# Patient Record
Sex: Male | Born: 1975 | Hispanic: Yes | Marital: Married | Smoking: Never smoker
Health system: Southern US, Community
[De-identification: ages and names within clinical notes are randomized; demographics above are authoritative.]

## PROBLEM LIST (undated history)

## (undated) DIAGNOSIS — Z6827 Body mass index (BMI) 27.0-27.9, adult: Secondary | ICD-10-CM

## (undated) DIAGNOSIS — K219 Gastro-esophageal reflux disease without esophagitis: Secondary | ICD-10-CM

## (undated) HISTORY — DX: Gastro-esophageal reflux disease without esophagitis: K21.9

---

## 2011-12-02 ENCOUNTER — Other Ambulatory Visit: Payer: Self-pay

## 2011-12-02 ENCOUNTER — Encounter (HOSPITAL_COMMUNITY): Payer: Self-pay | Admitting: *Deleted

## 2011-12-02 ENCOUNTER — Emergency Department (INDEPENDENT_AMBULATORY_CARE_PROVIDER_SITE_OTHER): Payer: Self-pay

## 2011-12-02 ENCOUNTER — Emergency Department (HOSPITAL_COMMUNITY)
Admission: EM | Admit: 2011-12-02 | Discharge: 2011-12-03 | Disposition: A | Discharge status: Home / Self Care | Payer: Self-pay | Attending: Emergency Medicine | Admitting: Emergency Medicine

## 2011-12-02 ENCOUNTER — Emergency Department (HOSPITAL_COMMUNITY)
Admission: EM | Admit: 2011-12-02 | Discharge: 2011-12-02 | Disposition: A | Discharge status: Nursing Home (Includes ICF) | Payer: Self-pay | Source: Home / Self Care | Attending: Emergency Medicine | Admitting: Emergency Medicine

## 2011-12-02 DIAGNOSIS — R0789 Other chest pain: Secondary | ICD-10-CM | POA: Insufficient documentation

## 2011-12-02 DIAGNOSIS — R079 Chest pain, unspecified: Secondary | ICD-10-CM

## 2011-12-02 LAB — DIFFERENTIAL
Eosinophils Relative: 0 % (ref 0–5)
Lymphocytes Relative: 14 % (ref 12–46)
Lymphs Abs: 1.7 10*3/uL (ref 0.7–4.0)
Monocytes Absolute: 0.7 10*3/uL (ref 0.1–1.0)
Monocytes Relative: 6 % (ref 3–12)
Neutro Abs: 9.6 10*3/uL — ABNORMAL HIGH (ref 1.7–7.7)

## 2011-12-02 LAB — COMPREHENSIVE METABOLIC PANEL
BUN: 10 mg/dL (ref 6–23)
CO2: 28 mEq/L (ref 19–32)
Calcium: 10.2 mg/dL (ref 8.4–10.5)
Chloride: 100 mEq/L (ref 96–112)
Creatinine, Ser: 0.77 mg/dL (ref 0.50–1.35)
GFR calc Af Amer: >90 mL/min (ref >90)
GFR calc non Af Amer: >90 mL/min (ref >90)
Glucose, Bld: 124 mg/dL — ABNORMAL HIGH (ref 70–99)
Total Bilirubin: 0.3 mg/dL (ref 0.3–1.2)

## 2011-12-02 LAB — CBC
MCH: 30.2 pg (ref 26.0–34.0)
MCHC: 36.1 g/dL — ABNORMAL HIGH (ref 30.0–36.0)
RBC: 5.37 MIL/uL (ref 4.22–5.81)

## 2011-12-02 LAB — CK TOTAL AND CKMB (NOT AT ARMC)
CK, MB: 3.2 ng/mL (ref 0.3–4.0)
Relative Index: 2.5 (ref 0.0–2.5)

## 2011-12-02 LAB — POCT I-STAT TROPONIN I: Troponin i, poc: 0 ng/mL (ref 0.00–0.08)

## 2011-12-02 MED ORDER — IBUPROFEN 800 MG PO TABS
800.0000 mg | ORAL_TABLET | Freq: Once | ORAL | Status: AC
  Administered 2011-12-03: 800 mg via ORAL
  Filled 2011-12-02: qty 1

## 2011-12-02 MED ORDER — HYDROCODONE-ACETAMINOPHEN 5-325 MG PO TABS
1.0000 | ORAL_TABLET | Freq: Once | ORAL | Status: AC
  Administered 2011-12-03: 1 via ORAL
  Filled 2011-12-02: qty 1

## 2011-12-02 MED ORDER — DIAZEPAM 5 MG PO TABS
5.0000 mg | ORAL_TABLET | Freq: Once | ORAL | Status: AC
  Administered 2011-12-03: 5 mg via ORAL
  Filled 2011-12-02: qty 1

## 2011-12-02 NOTE — ED Provider Notes (Signed)
Chief Complaint  Patient presents with  . Chest Pain    History of Present Illness:  The patient is a 36 year old male who has had a two-day history of left submammary chest pain without radiation. He denies any injury to the area. The pain is very severe and tends to come and go. It's not deathly pleuritic. It's not associated with meals, exertion, or position. He denies any associated symptoms of fever, chills, cough, wheezing, shortness of breath, palpitations, dizziness, syncope, abdominal pain, nausea, vomiting, or diarrhea. He has had no prior cardiac history. He denies any cardiac risk factors.  Review of Systems:  Other than noted above, the patient denies any of the following symptoms. Systemic:  No fever, chills, sweats, or fatigue. ENT:  No nasal congestion, rhinorrhea, or sore throat. Pulmonary:  No cough, wheezing, shortness of breath, sputum production, hemoptysis. Cardiac:  No palpitations, rapid heartbeat, dizziness, presyncope or syncope. GI:  No abdominal pain, heartburn, nausea, or vomiting. Skin:  No rash or itching. Ext:  No leg pain or swelling.   PMFSH:  Past medical history, family history, social history, meds, and allergies were reviewed and updated as needed.  Physical Exam:   Vital signs:  BP 115/70  Pulse 80  Temp(Src) 99.3 F (37.4 C) (Oral)  Resp 16  SpO2 99% Gen:  Alert, oriented, in no distress, skin warm and dry. Eye:  PERRL, lids and conjunctivas normal.  Sclera non-icteric. ENT:  Mucous membranes moist, pharynx clear. Neck:  Supple, no adenopathy or tenderness.  No JVD. Lungs:  Clear to auscultation, no wheezes, rales or rhonchi.  No respiratory distress. Heart:  Regular rhythm.  No gallops, murmers, clicks or rubs. Chest:  No chest wall tenderness. Abdomen:  Soft, nontender, no organomegaly or mass.  Bowel sounds normal.  No pulsatile abdominal mass or bruit. Ext:  No edema.  No calf tenderness and Homann's sign negative.  Pulses full and  equal. Skin:  Warm and dry.  No rash.  Labs:  No results found for this or any previous visit.   Radiology:  Dg Chest 2 View  12/02/2011  *RADIOLOGY REPORT*  Clinical Data: Chest pain for 3 days.  CHEST - 2 VIEW  Comparison: None.  Findings: The cardiac silhouette is normal size and shape. Mediastinal and hilar contours appear normal. The lungs are well aerated and free of infiltrates. No pleural abnormality is evident. Bones appear average for age. No pneumothorax evident.  IMPRESSION: No acute or active cardiopulmonary or pleural abnormality is seen.  Original Report Authenticated By: Crawford Givens, M.D.    EKG:   Date: 12/02/2011  Rate: 78  Rhythm: normal sinus rhythm  QRS Axis: normal  Intervals: normal  ST/T Wave abnormalities: normal  Conduction Disutrbances:none  Narrative Interpretation:   Old EKG Reviewed: none available   Medications given in UCC:  None  Assessment:   Diagnoses that have been ruled out:  None  Diagnoses that are still under consideration:  None  Final diagnoses:  Chest pain    Plan:   1. to be sent to the emergency department by shuttle.  Roque Lias, MD 12/02/11 [phone]

## 2011-12-02 NOTE — ED Notes (Signed)
All results back.  Awaiting evaluation by provider for disposition.

## 2011-12-02 NOTE — ED Notes (Signed)
Pt  Has  Symptoms  Of  l  Sided  Chest  Pain  Worse  When  He  Takes  A  Deep  Breath or  When  He  Raises  His  l  Arm        He  denys  Any  specefic  Injury  Although  He  Works  Using a  [employer]

## 2011-12-02 NOTE — ED Notes (Signed)
Pt report given and care endorsed to Jonny Ruiz, [place]

## 2011-12-02 NOTE — ED Notes (Signed)
The pt  Is a [facility] transfer he has had lt upper chest pain for 4 days.  No previous history

## 2011-12-02 NOTE — ED Notes (Signed)
Pt reports that pain remains 10/10.  Pain is exacerbated by movement.  Pt denies n/v.  Wife at bedside.

## 2011-12-03 LAB — D-DIMER, QUANTITATIVE (NOT AT ARMC): D-Dimer, Quant: 0.35 ug/mL-FEU (ref 0.00–0.48)

## 2011-12-03 MED ORDER — HYDROCODONE-ACETAMINOPHEN 5-325 MG PO TABS: 1.0000 | ORAL_TABLET | ORAL | Status: AC | PRN

## 2011-12-03 MED ORDER — IBUPROFEN 400 MG PO TABS: 400.0000 mg | ORAL_TABLET | Freq: Three times a day (TID) | ORAL | Status: AC | PRN

## 2011-12-03 MED ORDER — DIAZEPAM 5 MG PO TABS: 5.0000 mg | ORAL_TABLET | Freq: Three times a day (TID) | ORAL | Status: AC | PRN

## 2011-12-03 NOTE — ED Notes (Signed)
Given PO meds. Tolerated well. Pt alert, NAD, calm, interactive, skin W&D, resps e/u, speaking in clear complete sentences. Family at [facility]. EDNP into see pt, pt denies questions, needs concerns or sx unmet.

## 2011-12-07 NOTE — ED Provider Notes (Signed)
History     CSN: [id number]  Arrival date & time 12/02/11  Jason Haynes   First MD Initiated Contact with Patient 12/02/11 2259      Chief Complaint  Patient presents with  . Chest Pain    HPI: Patient is a 36 y.o. male presenting with chest pain. The history is provided by the patient.  Chest Pain The chest pain began 2 days ago. Chest pain occurs constantly. The chest pain is worsening. At its most intense, the pain is at 9/10. The pain is currently at 7/10. The quality of the pain is described as sharp. The pain does not radiate. Chest pain is worsened by certain positions and deep breathing. Pertinent negatives for primary symptoms include no fever, no shortness of breath, no cough, no wheezing, no palpitations, no abdominal pain, no nausea, no vomiting and no dizziness.  Pertinent negatives for associated symptoms include no diaphoresis, no lower extremity edema, no numbness, no orthopnea and no weakness. He tried NSAIDs for the symptoms. Risk factors include male gender.  Pertinent negatives for past medical history include no anxiety/panic attacks, no CAD, no diabetes, no DVT, no hyperlipidemia, no hypertension, no MI, no PE and no recent injury.  Pertinent negatives for family medical history include: no CAD in family, no early MI in family, no stroke in family and no sudden death in family.  Procedure history is negative for cardiac catheterization and exercise treadmill test.   HPI verified w/ interpreter phones. Reports 2 day hx of (L) sided CP that has been, non-radiating and constant, though pt admits intensity varies. Denies SOB, nausea, diaphoresis, dizziness or weakness. States pain is worse w/ movement, deep inspiration and certain positions. 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 Denies hx of CAD, DVT, recent extended travel, significant family hx of CAD or hx smoking. Was seen at [facility] this evening  and sent to ED for further evaluation. History reviewed. No pertinent past medical history.  History reviewed. No pertinent past surgical history.  History reviewed. No pertinent family history.  History  Substance Use Topics  . Smoking status: Not on file  . Smokeless tobacco: Not on file  . Alcohol Use: Yes     rarely      Review of Systems  Constitutional: Negative.  Negative for fever and diaphoresis.  HENT: Negative.   Eyes: Negative.   Respiratory: Negative.  Negative for cough, shortness of breath and wheezing.   Cardiovascular: Positive for chest pain. Negative for palpitations and orthopnea.  Gastrointestinal: Negative.  Negative for nausea, vomiting and abdominal pain.  Genitourinary: Negative.   Musculoskeletal: Negative.   Skin: Negative.   Neurological: Negative.  Negative for dizziness, weakness and numbness.  Hematological: Negative.   Psychiatric/Behavioral: Negative.     Allergies  Review of patient's allergies indicates no known allergies.  Home Medications   Current Outpatient Rx  Name Route Sig Dispense Refill  . ACETAMINOPHEN 325 MG PO TABS Oral Take 650 mg by mouth every 6 (six) hours as needed. For pain    . DIAZEPAM 5 MG PO TABS Oral Take 1 tablet (5 mg total) by mouth every 8 (eight) hours as needed (Muscle pain/spasm). 10 tablet 0  . HYDROCODONE-ACETAMINOPHEN 5-325 MG PO TABS Oral Take 1 tablet by mouth every 4 (four) hours as needed for pain. 10 tablet 0  . IBUPROFEN 400 MG PO TABS Oral Take 1 tablet (400 mg total) by  mouth every 8 (eight) hours as needed for pain (1 PO TID x 3 days then PRN only). 15 tablet 0    BP 127/78  Pulse 93  Temp(Src) 98.8 F (37.1 C) (Oral)  Resp 18  SpO2 98%  Physical Exam  Constitutional: He is oriented to person, place, and time. He appears well-developed and well-nourished.  HENT:  Head: Normocephalic and atraumatic.  Eyes: Conjunctivae are normal.  Neck: Neck supple.  Cardiovascular: Normal rate and  regular rhythm.  Exam reveals no gallop and no friction rub.   No murmur heard. Pulmonary/Chest: Effort normal and breath sounds normal. He exhibits tenderness. He exhibits no crepitus.         (L) chest wall TTP  Abdominal: Soft. Bowel sounds are normal.  Musculoskeletal: Normal range of motion.  Neurological: He is alert and oriented to person, place, and time.  Skin: Skin is warm and dry.  Psychiatric: He has a normal mood and affect.    ED Course  Procedures   CXR, TROP I and remaining labs unremarkable. Assessment c/w musculoskeletal chest wall pain. Discussed pt w/ Dr Hyman Hopes. Will treat for musculoskeletal chest wall pain, obtain D-Dimer, 2nd Trop I  and re-eval.  D-Dimer and 2nd Trop I negative. Pt reports chest wall pain has improved w/ medication. Via interpreter phones findings and clinical impressions discussed w/ pt. Will plan for d/c home w/ tx for musculoskeletal chest wall pain and provide PCP referrals for f/u. Pt agreeable w/ plan.     Labs Reviewed  CBC - Abnormal; Notable for the following:    WBC 11.9 (*)    MCHC 36.1 (*)    All other components within normal limits  DIFFERENTIAL - Abnormal; Notable for the following:    Neutrophils Relative 80 (*)    Neutro Abs 9.6 (*)    All other components within normal limits  COMPREHENSIVE METABOLIC PANEL - Abnormal; Notable for the following:    Glucose, Bld 124 (*)    All other components within normal limits  CK TOTAL AND CKMB  POCT I-STAT TROPONIN I  POCT I-STAT TROPONIN I  D-DIMER, QUANTITATIVE  LAB REPORT - SCANNED   No results found.   1. Chest pain, musculoskeletal       MDM  HPI/PE and clinical findings c/w musculoskeletal chest wall pain. Pain worse w/ palpation, deep inspiration and certain positions. Few risk factors for CAD. CXR/EKG and w/o acute findings.No past med hx, VS stable,  Trop I neg x 2, D-Dimer negative. Relief voiced w/ muscle relaxer and medication for pain        Leanne Chang, NP 12/07/11 1004

## 2011-12-07 NOTE — ED Provider Notes (Signed)
Medical screening examination/treatment/procedure(s) were performed by non-physician practitioner and as supervising physician I was immediately available for consultation/collaboration.   Forbes Cellar, MD 12/07/11 [phone]

## 2014-04-12 ENCOUNTER — Encounter (HOSPITAL_COMMUNITY): Payer: Self-pay | Admitting: Emergency Medicine

## 2014-04-12 ENCOUNTER — Emergency Department (INDEPENDENT_AMBULATORY_CARE_PROVIDER_SITE_OTHER)
Admission: EM | Admit: 2014-04-12 | Discharge: 2014-04-12 | Disposition: A | Discharge status: Home / Self Care | Payer: Self-pay | Source: Home / Self Care

## 2014-04-12 DIAGNOSIS — J029 Acute pharyngitis, unspecified: Secondary | ICD-10-CM

## 2014-04-12 LAB — POCT RAPID STREP A: Streptococcus, Group A Screen (Direct): NEGATIVE

## 2014-04-12 NOTE — ED Notes (Signed)
Pt  Reports  Symptoms  Of  sorethroat   With  Swollen  Gland    And  Pain  When  He  Swallows   With  Symptoms  For  Several  Days

## 2014-04-12 NOTE — ED Provider Notes (Signed)
CSN: [id number]     Arrival date & time 04/12/14  0818 History   First MD Initiated Contact with Patient 04/12/14 0840     Chief Complaint  Patient presents with  . Sore Throat   (Consider location/radiation/quality/duration/timing/severity/associated sxs/prior Treatment) HPI Comments: 38 year old male reports a 2 day history of sore throat. Denies fever, chills, earache, abdominal pain, vomiting or PND.   History reviewed. No pertinent past medical history. History reviewed. No pertinent past surgical history. History reviewed. No pertinent family history. History  Substance Use Topics  . Smoking status: Not on file  . Smokeless tobacco: Not on file  . Alcohol Use: Yes     Comment: rarely    Review of Systems  Constitutional: Negative for fever, chills and activity change.  HENT: Positive for sore throat. Negative for congestion, postnasal drip and sinus pressure.   Eyes: Negative.   Respiratory: Negative for cough, shortness of breath and wheezing.   Cardiovascular: Negative.   Gastrointestinal: Negative.     Allergies  Review of patient's allergies indicates no known allergies.  Home Medications   Prior to Admission medications   Medication Sig Start Date End Date Taking? Authorizing Provider  acetaminophen (TYLENOL) 325 MG tablet Take 650 mg by mouth every 6 (six) hours as needed. For pain    Historical Provider, MD   BP 129/89  Pulse 80  Temp(Src) 98.8 F (37.1 C) (Oral)  Resp 18  SpO2 100% Physical Exam  Nursing note and vitals reviewed. Constitutional: He is oriented to person, place, and time. He appears well-developed and well-nourished. No distress.  HENT:  Head: Normocephalic and atraumatic.  Mouth/Throat: No oropharyngeal exudate.  Bilat TM's occluded by wax. OP with mild redness, clear PND  Eyes: Conjunctivae and EOM are normal.  Neck: Normal range of motion. Neck supple.  Cardiovascular: Normal rate, regular rhythm and normal heart sounds.    Pulmonary/Chest: Effort normal and breath sounds normal. No respiratory distress. He has no wheezes.  Lymphadenopathy:    He has no cervical adenopathy.  Neurological: He is alert and oriented to person, place, and time.  Skin: Skin is warm and dry.    ED Course  Procedures (including critical care time) Labs Review Labs Reviewed  POCT RAPID STREP A ([facility] ONLY)    Imaging Review No results found. Results for orders placed during the hospital encounter of 04/12/14  POCT RAPID STREP A ([facility] ONLY)      Result Value Ref Range   Streptococcus, Group A Screen (Direct) NEGATIVE  NEGATIVE     MDM   1. Pharyngitis     claritin or allegra Tylenol     Hayden Rasmussen, NP 04/12/14 [phone]

## 2014-04-12 NOTE — Discharge Instructions (Signed)
Faringitis (Pharyngitis) Use claritin or allegra to help with drainage Tylenol as needed La faringitis es el dolor de garganta (faringe). La garganta presenta enrojecimiento, hinchazn y dolor. CUIDADOS EN EL HOGAR   Beba suficiente lquido para mantener la orina clara o de color amarillo plido.  Solo tome los medicamentos que le haya indicado su mdico.  Si no toma los medicamentos segn las indicaciones podra volver a enfermarse. Finalice la prescripcin completa, aunque comience a sentirse mejor.  No tome aspirina.  Reposo.  Enjuguese la boca Arts administrator) con agua y sal (cucharadita de sal por litro de agua) cada 1 o 2horas. Esto ayudar a Engineer, materials.  Si no corre riesgo de ahogarse, puede chupar un caramelo duro o pastillas para la garganta. SOLICITE AYUDA SI:  Tiene bultos grandes y dolorosos al tacto en el cuello.  Tiene una erupcin cutnea.  Cuando tose elimina una expectoracin verde, amarillo amarronado o con [place]. SOLICITE AYUDA DE INMEDIATO SI:   Presenta rigidez en el cuello.  Babea o no puede tragar lquidos.  Vomita o no puede retener los [employer] lquidos.  Siente un dolor intenso que no se alivia con medicamentos.  Tiene problemas para Industrial/product designer (y no debido a la nariz tapada). ASEGRESE DE QUE:   Comprende estas instrucciones.  Controlar su afeccin.  Recibir ayuda de inmediato si no mejora o si empeora. Document Released: 01/16/2009 Document Revised: 08/10/2013 [facility] Patient Information 2014 [place], [place].  Grgaras de agua con sal ([place] Gargle) Esta solucin har que sienta alivio en la boca y la garganta. INSTRUCCIONES PARA EL CUIDADO DOMICILIARIO  Mezcle 1 cucharadita de sal en 8 onzas de agua tibia.  Haga grgaras con esta solucin con la frecuencia que necesite o segn le hayan indicado. Hgalo suavemente si tiene lesiones o lastimaduras en la boca.  No trague esta solucin. Document Released:  02/05/2009 Document Revised: 01/12/2012 [facility] Patient Information 2014 [place], [place].  Dolor de garganta  (Sore Throat)  El dolor de garganta es el dolor, ardor, irritacin o sensacin de picazn en la garganta. Generalmente hay dolor o molestias al tragar o hablar. Un dolor de garganta puede estar acompaado de otros sntomas, como tos, estornudos, fiebre y ganglios hinchados en el cuello. Generalmente es Financial risk analyst signo de otra enfermedad, como un resfrio, gripe, anginas o mononucleosis (conocida como mono). La mayor parte de los dolores de garganta desaparecen sin tratamiento mdico. CAUSAS  Las causas ms comunes de dolor de garganta son:   Infecciones virales, como un resfrio, gripe o mononucleosis.  Infeccin bacteriana, como faringitis estreptoccica, amigdalitis, o tos ferina.  Alergias estacionales.  La sequedad en el aire.  Algunos irritantes, como el humo o la polucin.  Reflujo gastroesofgico. INSTRUCCIONES PARA EL CUIDADO EN EL HOGAR   Tome slo la medicacin que le indic el mdico.  Debe ingerir gran cantidad de lquido para mantener la orina de tono claro o color amarillo plido.  Descanse todo lo que sea necesario.  Trate de usar [employer] para la garganta, pastillas o chupe caramelos duros para Engineer, materials (si es mayor de 4 aos o segn lo que le indiquen).  Beba lquidos calientes, como caldos, infusiones de hierbas o agua caliente con miel para calmar el dolor momentneamente. Tambin puede comer o beber lquidos fros o congelados tales como paletas de hielo congelado.  Haga grgaras con agua con sal (mezclar 1 cucharadita de sal en 8 onzas [250 cm3] de agua).  No fume, y evite el humo de otros  fumadores.  Ponga un humidificador de vapor fro en la habitacin por la noche para humedecer el aire. Tambin se puede activar en una ducha de agua caliente y sentarse en el bao con la puerta cerrada durante 5-10 minutos. SOLICITE ATENCIN MDICA DE  INMEDIATO SI:   Tiene dificultad para respirar.  No puede tragar lquidos, alimentos blandos, o su saliva.  Usted tiene ms inflamacin en la garganta.  El dolor de garganta no mejora en [address] das.  Tiene nuseas o vmitos.  Tiene fiebre o sntomas que persisten durante ms de 2 o 3 das.  Tiene fiebre y los sntomas empeoran de manera sbita. ASEGRESE DE QUE:   Comprende estas instrucciones.  Controlar su enfermedad.  Solicitar ayuda de inmediato si no mejora o si empeora. Document Released: 10/20/2005 Document Revised: 10/06/2012 [facility] Patient Information 2014 [place], [place].

## 2014-04-13 NOTE — ED Provider Notes (Signed)
Medical screening examination/treatment/procedure(s) were performed by a resident physician or non-physician practitioner and as the supervising physician I was immediately available for consultation/collaboration.  Kennia Vanvorst, MD    Shelbe Haglund S Lennan Malone, MD 04/13/14 0751 

## 2014-04-14 LAB — CULTURE, GROUP A STREP

## 2016-03-19 ENCOUNTER — Emergency Department (HOSPITAL_COMMUNITY)
Admission: EM | Admit: 2016-03-19 | Discharge: 2016-03-19 | Disposition: A | Discharge status: Home / Self Care | Payer: Self-pay | Attending: Emergency Medicine | Admitting: Emergency Medicine

## 2016-03-19 ENCOUNTER — Encounter (HOSPITAL_COMMUNITY): Payer: Self-pay | Admitting: *Deleted

## 2016-03-19 DIAGNOSIS — Z79899 Other long term (current) drug therapy: Secondary | ICD-10-CM | POA: Insufficient documentation

## 2016-03-19 DIAGNOSIS — H6123 Impacted cerumen, bilateral: Secondary | ICD-10-CM | POA: Insufficient documentation

## 2016-03-19 DIAGNOSIS — H9201 Otalgia, right ear: Secondary | ICD-10-CM | POA: Insufficient documentation

## 2016-03-19 MED ORDER — AMOXICILLIN 500 MG PO CAPS: 500.0000 mg | ORAL_CAPSULE | Freq: Two times a day (BID) | ORAL | Status: DC

## 2016-03-19 MED ORDER — NAPROXEN 500 MG PO TABS: 500.0000 mg | ORAL_TABLET | Freq: Two times a day (BID) | ORAL | Status: DC

## 2016-03-19 MED ORDER — CARBAMIDE PEROXIDE 6.5 % OT SOLN: 10.0000 [drp] | Freq: Two times a day (BID) | OTIC | Status: DC

## 2016-03-19 MED ORDER — DOCUSATE SODIUM 50 MG/5ML PO LIQD
50.0000 mg | Freq: Once | ORAL | Status: AC
  Administered 2016-03-19: 50 mg via OTIC
  Filled 2016-03-19: qty 10

## 2016-03-19 MED ORDER — NAPROXEN 250 MG PO TABS: 500.0000 mg | ORAL_TABLET | Freq: Once | ORAL | Status: DC

## 2016-03-19 NOTE — Discharge Instructions (Signed)
Tapn de cerumen (Cerumen Impaction) Las estructuras del canal auditivo externo secretan una sustancia cerosa llamada cerumen. El exceso de cerumen puede acumularse en el canal auditivo y causar una afeccin conocida como tapn de cerumen. El tapn de cerumen puede producir dolor de odo y Art therapist el funcionamiento de este rgano. La velocidad de produccin del cerumen es diferente en cada persona. En algunas, la estructura del canal auditivo puede reducir su capacidad de eliminar el cerumen de forma natural. CAUSAS La causa del tapn de cerumen es el exceso de produccin o la acumulacin de esta secrecin. FACTORES DE RIESGO  Usar hisopos frecuentemente para limpiarse los odos.  Tener los canales [employer] estrechos.  Tener eccema.  Estar deshidratado. SIGNOS Y SNTOMAS  Disminucin de la audicin.  Secrecin del odo.  Dolor de odo.  Picazn en el odo. TRATAMIENTO El tratamiento puede incluir lo siguiente:  Gotas ticas de venta libre o recetadas para ablandar el cerumen.  Extraccin del cerumen a cargo de Optician, dispensing. Esto se puede hacer de las siguientes maneras:  Lavado con agua tibia. Este es el mtodo de extraccin ms comn.  Cucharillas de raspado y otros instrumentos para el odo.  Ciruga. Esta puede realizarse [employer] casos son graves. [place] los medicamentos solamente como se lo haya indicado el mdico.  No se introduzca objetos en el odo con la intencin de limpiarlo. PREVENCIN  No se introduzca objetos en el odo, aunque sea con la intencin de limpiarlo. No es necesario quitar el cerumen como parte de la higiene normal, y no se recomienda el uso de hisopos en el canal auditivo.  Beba suficiente agua para mantener la orina clara o de color amarillo plido.  Controle el eccema, si lo tiene. SOLICITE ATENCIN MDICA SI:  Tiene dolor de odo.  Le sangra el odo.  El cerumen no se elimina despus de  colocarse gotas ticas como se lo indicaron.   Esta informacin no tiene Marine scientist el consejo del mdico. Asegrese de hacerle al mdico cualquier pregunta que tenga.   Document Released: 10/20/2005 Document Revised: 11/10/2014 Elsevier Interactive Patient Education 2016 [place] odos (Earache) El dolor de odos, tambin llamado otalgia, puede tener muchas causas. Puede ser agudo, sordo o ardiente, transitorio o Agricultural engineer. Los dolores de odos pueden deberse a problemas de los odos, como infecciones en el odo medio o el conducto auditivo externo, lesiones, tapones de cera, presin en el odo medio o un cuerpo extrao en el odo. Tambin, a problemas en otras zonas, lo que se conoce como dolor referido. Por ejemplo, el dolor puede deberse a una faringitis, una infeccin dental o problemas de la mandbula o de la articulacin que se encuentra entre la mandbula y el crneo (articulacin temporomandibular o ATM). No siempre es fcil identificar la causa de un dolor de odos. La observacin cautelosa puede ser la [place] en el caso de algunos dolores de odos, [place] se descubra una causa clara. INSTRUCCIONES PARA EL CUIDADO EN EL HOGAR Controle su afeccin para ver si hay cambios. Las siguientes medidas pueden servir para Public house manager cualquier molestia que est sintiendo:  Tome los medicamentos solamente como se lo haya indicado el mdico. Esto incluye la aplicacin de las gotas ticas.  Pngase hielo en la oreja para ayudar a Best boy.  Ponga el hielo en una bolsa plstica.  Coloque una toalla entre la piel y la bolsa de hielo.  Coloque el hielo  durante 85mnutos, 2 a 3veces por da.  No se ponga nada en el odo que no sean los medicamentos que eSpecial educational needs teacher  Intente descansar en posicin erguida, en lugar de recostarse. Esto puede ayudar a reducir la presin en el odo medio y aBest boy  Mastique goma de mascar si esto  ayuda a aBest boyde odos.  Controle cualquier alergia que tenga.  Concurra a todas las visitas de control como se lo haya indicado el mdico. Esto es importante. SOLICITE ATENCIN MDICA SI:  El dolor no mejora en el trmino de 2das.  Tiene fiebre.  Tiene sntomas nuevos o estos empeoran. SOLICITE ATENCIN MDICA DE INMEDIATO SI:  Sufre un dolor intenso de [place]  Presenta rigidez en el cuello.  Tiene dificultad para tragar.  Hay enrojecimiento o hinchazn detrs de la oreja.  Tiene secrecin del odo.  Tiene prdida de la audicin.  Siente mareos.   Esta informacin no tiene cMarine scientistel consejo del mdico. Asegrese de hacerle al mdico cualquier pregunta que tenga.   Document Released: 01/27/2008 Document Revised: 11/10/2014 Elsevier Interactive Patient Education 2[employer]

## 2016-03-19 NOTE — ED Provider Notes (Signed)
CSN: [id number]     Arrival date & time 03/19/16  0631 History   First MD Initiated Contact with Patient 03/19/16 [phone]     Chief Complaint  Patient presents with  . Otalgia   HPI  Jason Haynes is an 40 y.o. male with no significant PMH who presents to the ED for evaluation of right ear pain. History is obtained via Spanish language interpreter. Pt reports sharp, constant right ear pain since last night. He states it feels like the pain is inside. He does endorse some associated mild muffled hearing. Denies tinnitis. Denies external ear swelling or pain. Denies bleeding or discharge. He has not tried anything to alleviate his pain. He denies issues with his left ear. Denies URI symptoms, fever, chills.   History reviewed. No pertinent past medical history. History reviewed. No pertinent past surgical history. No family history on file. Social History  Substance Use Topics  . Smoking status: Never Smoker   . Smokeless tobacco: None  . Alcohol Use: Yes     Comment: rarely    Review of Systems  Constitutional: Negative for fever and chills.  HENT: Positive for ear pain and hearing loss. Negative for ear discharge, facial swelling, rhinorrhea, sneezing and sore throat.   Eyes: Negative.   Respiratory: Negative for cough.   Skin: Negative for rash and wound.  Neurological: Negative for dizziness and light-headedness.      Allergies  Review of patient's allergies indicates no known allergies.  Home Medications   Prior to Admission medications   Medication Sig Start Date End Date Taking? Authorizing Provider  acetaminophen (TYLENOL) 325 MG tablet Take 650 mg by mouth every 6 (six) hours as needed. For pain    Historical Provider, MD   BP 127/95 mmHg  Pulse 71  Temp(Src) 97.6 F (36.4 C)  Resp 16  SpO2 100% Physical Exam  Constitutional: He is oriented to person, place, and time. No distress.  HENT:  Head: Atraumatic.  Right Ear: External ear normal.  Left Ear: External  ear normal.  Nose: Nose normal.  Mouth/Throat: Oropharynx is clear and moist. No oropharyngeal exudate.  Bilateral cerumen impaction. TM unable to be visualized. No external ear erythema or edema. No external ear tenderness or tragal tenderness. No mastoid erythema, edema, or ttp. No trismus. No intraoral lesions.  Eyes: Conjunctivae and EOM are normal. Pupils are equal, round, and reactive to light. No scleral icterus.  Neck: Normal range of motion. Neck supple.  Cardiovascular: Normal rate and regular rhythm.   Pulmonary/Chest: Effort normal. No respiratory distress. He exhibits no tenderness.  Abdominal: Soft. He exhibits no distension. There is no tenderness.  Neurological: He is alert and oriented to person, place, and time.  Skin: Skin is warm and dry. He is not diaphoretic.  Psychiatric: He has a normal mood and affect. His behavior is normal.  Nursing note and vitals reviewed.   ED Course  .Ear Cerumen Removal Date/Time: 03/19/2016 6:59 AM Performed by: Carlene CoriaSAM, Timoteo Carreiro Y Authorized by: Carlene CoriaSAM, Koehn Salehi Y Consent: Verbal consent obtained. Risks and benefits: risks, benefits and alternatives were discussed Consent given by: patient Patient understanding: patient states understanding of the procedure being performed Patient identity confirmed: verbally with patient and arm band Local anesthetic: none Ceruminolytics applied: Ceruminolytics applied prior to the procedure. Location details: right ear Procedure type: curette and irrigation Patient sedated: no Patient tolerance: Patient tolerated the procedure well with no immediate complications    Labs Review Labs Reviewed - No data to display  Imaging Review No results found. I have personally reviewed and evaluated these images and lab results as part of my medical decision-making.   EKG Interpretation None      MDM   Final diagnoses:  Otalgia, right  Cerumen impaction, bilateral    Pt with bilateral cerumen impaction  making visualization of TMs impossible. Will perform cerumen removal and then re-examine. Otherwise ears NL with no evidence of otitis externa or mastoiditis.   I was able to remove only a minimal amount of pt's cerumen, both with curette and irrigation after ceruminolytics were applied. I still am unable to visualize either TM. Canal without erythema or edema. Will give rx for debrox and naproxen. Will also cover for AOM with amoxicillin. INstructed pt to call for ENT f/u. ER return precautions given.    Carlene Coria, PA-C 03/19/16 5409  Tilden Fossa, MD 03/23/16 [phone]

## 2016-03-19 NOTE — ED Notes (Signed)
Pt c/o R ear pain since last night; redness noted

## 2016-07-02 ENCOUNTER — Emergency Department (HOSPITAL_COMMUNITY): Payer: Self-pay

## 2016-07-02 ENCOUNTER — Encounter (HOSPITAL_COMMUNITY): Payer: Self-pay | Admitting: Emergency Medicine

## 2016-07-02 ENCOUNTER — Emergency Department (HOSPITAL_COMMUNITY)
Admission: EM | Admit: 2016-07-02 | Discharge: 2016-07-02 | Disposition: A | Discharge status: Home / Self Care | Payer: Self-pay | Attending: Emergency Medicine | Admitting: Emergency Medicine

## 2016-07-02 DIAGNOSIS — Z79899 Other long term (current) drug therapy: Secondary | ICD-10-CM | POA: Insufficient documentation

## 2016-07-02 DIAGNOSIS — R42 Dizziness and giddiness: Secondary | ICD-10-CM | POA: Insufficient documentation

## 2016-07-02 MED ORDER — MECLIZINE HCL 25 MG PO TABS
50.0000 mg | ORAL_TABLET | Freq: Once | ORAL | Status: AC
  Administered 2016-07-02: 50 mg via ORAL
  Filled 2016-07-02: qty 2

## 2016-07-02 MED ORDER — MECLIZINE HCL 25 MG PO TABS: 25.0000 mg | ORAL_TABLET | Freq: Three times a day (TID) | ORAL | 0 refills | Status: DC | PRN

## 2016-07-02 NOTE — ED Notes (Signed)
Patient transported to MRI 

## 2016-07-02 NOTE — ED Notes (Signed)
Pt dc home with significant other 

## 2016-07-02 NOTE — Discharge Instructions (Addendum)
Take medications until 24 hours without vertigo.

## 2016-07-02 NOTE — ED Notes (Signed)
MD at bedside while using interpreter to d/c and review d/c instructions. Significant other at bedside.

## 2016-07-02 NOTE — ED Triage Notes (Signed)
Pt from home with c/o blurred vision and dizziness x 3 months worsening today.  Denies hx of HTN or DM.  NAD, A&O.

## 2016-07-02 NOTE — ED Provider Notes (Signed)
[facility]-EMERGENCY DEPT Provider Note   CSN: [id number] Arrival date & time: 07/02/16  1355     History   Chief Complaint Chief Complaint  Patient presents with  . Blurred Vision  . Dizziness    HPI Jason Haynes is a 40 y.o. male with no major medical problems presents to the Emergency Department complaining of gradual, persistent, progressively worsening dizziness onset 3 mos ago after having his ears cleaned. Associated symptoms include flashes of lights in his vision onset at the same time.  He reports associated nausea and numbness in the pinky and ring fingers of both hands.  Pt described the dizziness as if the furniture in the room is spinning.   Pt reports a little pain in his neck in the right side But denies falls or trauma.  Patient reports that movement makes the symptoms worse. Nothing makes them better. He has been taking several vitamins prescribed in [place] without relief.     The history is provided by the patient and medical records. The history is limited by a language barrier. A language interpreter was used.    History reviewed. No pertinent past medical history.  There are no active problems to display for this patient.   History reviewed. No pertinent surgical history.     Home Medications    Prior to Admission medications   Medication Sig Start Date End Date Taking? Authorizing Provider  acetaminophen (TYLENOL) 325 MG tablet Take 650 mg by mouth every 6 (six) hours as needed. For pain    Historical Provider, MD  amoxicillin (AMOXIL) 500 MG capsule Take 1 capsule (500 mg total) by mouth 2 (two) times daily. 03/19/16   Ace Gins Sam, PA-C  carbamide peroxide (DEBROX) 6.5 % otic solution Place 10 drops into both ears 2 (two) times daily. 03/19/16   Ace Gins Sam, PA-C  naproxen (NAPROSYN) 500 MG tablet Take 1 tablet (500 mg total) by mouth 2 (two) times daily. 03/19/16   Carlene Coria, PA-C    Family History History reviewed. No pertinent family  history.  Social History Social History  Substance Use Topics  . Smoking status: Never Smoker  . Smokeless tobacco: Never Used  . Alcohol use No     Comment: rarely     Allergies   Review of patient's allergies indicates no known allergies.   Review of Systems Review of Systems  Eyes: Positive for visual disturbance.  Neurological: Positive for dizziness and numbness.  All other systems reviewed and are negative.    Physical Exam Updated Vital Signs BP 129/81 (BP Location: Left Arm)   Pulse 74   Temp 97.4 F (36.3 C) (Oral)   Resp 16   Ht 5' 8.9" (1.75 m)   Wt 75.3 kg   SpO2 100%   BMI 24.59 kg/m   Physical Exam  Constitutional: He is oriented to person, place, and time. He appears well-developed and well-nourished. No distress.  HENT:  Head: Normocephalic and atraumatic.  Mouth/Throat: Oropharynx is clear and moist.  Eyes: Conjunctivae and EOM are normal. Pupils are equal, round, and reactive to light. No scleral icterus.  No horizontal, vertical or rotational nystagmus  Neck: Normal range of motion. Neck supple.  Full active and passive ROM without pain No midline or paraspinal tenderness No nuchal rigidity or meningeal signs  Cardiovascular: Normal rate, regular rhythm and intact distal pulses.   Pulmonary/Chest: Effort normal and breath sounds normal. No respiratory distress. He has no wheezes. He has no rales.  Abdominal:  Soft. Bowel sounds are normal. There is no tenderness. There is no rebound and no guarding.  Musculoskeletal: Normal range of motion.  Lymphadenopathy:    He has no cervical adenopathy.  Neurological: He is alert and oriented to person, place, and time. He has normal reflexes. No cranial nerve deficit. He exhibits normal muscle tone. Coordination normal.  Mental Status:  Alert, oriented, thought content appropriate. Speech fluent without evidence of aphasia. Able to follow 2 step commands without difficulty.  Cranial Nerves:  II:   Peripheral visual fields grossly normal, pupils equal, round, reactive to light III,IV, VI: ptosis not present, extra-ocular motions intact bilaterally  V,VII: smile symmetric, facial light touch sensation equal VIII: hearing grossly normal bilaterally  IX,X: midline uvula rise  XI: bilateral shoulder shrug equal and strong XII: midline tongue extension  Motor:  5/5 in upper and lower extremities bilaterally including strong and equal grip strength and dorsiflexion/plantar flexion Sensory: Pinprick and light touch normal in bilateral lower extremities; subjectively decreased to the medial portion of the bilateral lower arms.  Deep Tendon Reflexes: 2+ and symmetric  Cerebellar: normal finger-to-nose with bilateral upper extremities Gait: normal gait and balance CV: distal pulses palpable throughout   Skin: Skin is warm and dry. No rash noted. He is not diaphoretic.  Psychiatric: He has a normal mood and affect. His behavior is normal. Judgment and thought content normal.  Nursing note and vitals reviewed.    ED Treatments / Results   Procedures Procedures (including critical care time)  Medications Ordered in ED Medications  meclizine (ANTIVERT) tablet 50 mg (50 mg Oral Given 07/02/16 1542)     Initial Impression / Assessment and Plan / ED Course  I have reviewed the triage vital signs and the nursing notes.  Pertinent labs & imaging results that were available during my care of the patient were reviewed by me and considered in my medical decision making (see chart for details).  Clinical Course  Value Comment By Time   The patient was discussed with and seen by Dr. Fayrene FearingJames who agrees with the treatment plan.   Dahlia ClientHannah Neeka Urista, PA-C 08/30 1500  BP: 129/81 Vital signs stable Dierdre ForthHannah Aneudy Champlain, PA-C 08/30 1546   At shift change care transferred to Dr. Rolland PorterMark James who will follow imaging. Dahlia ClientHannah Kree Rafter, PA-C 08/30 1548    Pt with dizziness, visual disturbance and  numbness in the bilateral upper extremities. Concern for possible MS versus other central causes of patient's vertigo. Will obtain MRI head and neck for further evaluation.      Final Clinical Impressions(s) / ED Diagnoses   Final diagnoses:  Vertigo  Dizziness    New Prescriptions New Prescriptions   No medications on file     Dierdre ForthHannah Karisa Nesser, PA-C 07/02/16 1549    Rolland PorterMark James, MD 07/02/16 [phone]

## 2020-10-28 ENCOUNTER — Other Ambulatory Visit: Payer: Self-pay

## 2020-10-28 ENCOUNTER — Emergency Department (HOSPITAL_COMMUNITY)
Admission: EM | Admit: 2020-10-28 | Discharge: 2020-10-29 | Disposition: A | Discharge status: Home / Self Care | Payer: HRSA Program | Attending: Emergency Medicine | Admitting: Emergency Medicine

## 2020-10-28 DIAGNOSIS — U071 COVID-19: Secondary | ICD-10-CM | POA: Insufficient documentation

## 2020-10-28 DIAGNOSIS — R1013 Epigastric pain: Secondary | ICD-10-CM | POA: Diagnosis present

## 2020-10-28 HISTORY — DX: Body mass index (BMI) 27.0-27.9, adult: Z68.27

## 2020-10-28 LAB — COMPREHENSIVE METABOLIC PANEL
ALT: 45 U/L — ABNORMAL HIGH (ref 0–44)
AST: 39 U/L (ref 15–41)
Albumin: 3.9 g/dL (ref 3.5–5.0)
Alkaline Phosphatase: 71 U/L (ref 38–126)
Anion gap: 13 (ref 5–15)
BUN: 13 mg/dL (ref 6–20)
CO2: 25 mmol/L (ref 22–32)
Calcium: 9.6 mg/dL (ref 8.9–10.3)
Chloride: 100 mmol/L (ref 98–111)
Creatinine, Ser: 1.04 mg/dL (ref 0.61–1.24)
GFR, Estimated: >60 mL/min (ref >60)
Glucose, Bld: 105 mg/dL — ABNORMAL HIGH (ref 70–99)
Potassium: 3.8 mmol/L (ref 3.5–5.1)
Sodium: 138 mmol/L (ref 135–145)
Total Bilirubin: 0.5 mg/dL (ref 0.3–1.2)
Total Protein: 7.3 g/dL (ref 6.5–8.1)

## 2020-10-28 LAB — CBC
HCT: 48.1 % (ref 39.0–52.0)
Hemoglobin: 16 g/dL (ref 13.0–17.0)
MCH: 28.9 pg (ref 26.0–34.0)
MCHC: 33.3 g/dL (ref 30.0–36.0)
MCV: 87 fL (ref 80.0–100.0)
Platelets: 106 10*3/uL — ABNORMAL LOW (ref 150–400)
RBC: 5.53 MIL/uL (ref 4.22–5.81)
RDW: 12.7 % (ref 11.5–15.5)
WBC: 3.7 10*3/uL — ABNORMAL LOW (ref 4.0–10.5)
nRBC: 0 % (ref 0.0–0.2)

## 2020-10-28 LAB — RESP PANEL BY RT-PCR (FLU A&B, COVID) ARPGX2
Influenza A by PCR: NEGATIVE
Influenza B by PCR: NEGATIVE
SARS Coronavirus 2 by RT PCR: POSITIVE — AB

## 2020-10-28 LAB — URINALYSIS, ROUTINE W REFLEX MICROSCOPIC
Bilirubin Urine: NEGATIVE
Glucose, UA: NEGATIVE mg/dL
Hgb urine dipstick: NEGATIVE
Ketones, ur: NEGATIVE mg/dL
Leukocytes,Ua: NEGATIVE
Nitrite: NEGATIVE
Protein, ur: NEGATIVE mg/dL
Specific Gravity, Urine: 1.019 (ref 1.005–1.030)
pH: 5 (ref 5.0–8.0)

## 2020-10-28 LAB — LIPASE, BLOOD: Lipase: 33 U/L (ref 11–51)

## 2020-10-28 MED ORDER — ACETAMINOPHEN 325 MG PO TABS: 650.0000 mg | ORAL_TABLET | Freq: Once | ORAL | Status: DC | PRN

## 2020-10-28 NOTE — ED Triage Notes (Addendum)
Pt c/o right flank pain and abdominal pain and states that it hurts when he voids. Also states he would like a Covid test because his stomach hurts. Pt also c/o anal pain. States has been taking Tylenol and something for the flu (Desenfriol).

## 2020-10-29 ENCOUNTER — Encounter (HOSPITAL_COMMUNITY): Payer: Self-pay | Admitting: Physician Assistant

## 2020-10-29 DIAGNOSIS — Z6827 Body mass index (BMI) 27.0-27.9, adult: Secondary | ICD-10-CM | POA: Insufficient documentation

## 2020-10-29 MED ORDER — ONDANSETRON HCL 4 MG PO TABS: 4.0000 mg | ORAL_TABLET | Freq: Four times a day (QID) | ORAL | 0 refills | Status: DC

## 2020-10-29 MED ORDER — ACETAMINOPHEN 325 MG PO TABS
650.0000 mg | ORAL_TABLET | Freq: Once | ORAL | Status: AC
  Administered 2020-10-29: 650 mg via ORAL
  Filled 2020-10-29: qty 2

## 2020-10-29 NOTE — ED Provider Notes (Signed)
[facility] EMERGENCY DEPARTMENT Provider Note   CSN: [id number] Arrival date & time: 10/28/20  1940     History Chief Complaint  Patient presents with  . Flank Pain    Ruhaan Nordahl Molli Hazard is a 44 y.o. male with no pertinent past medical history.  Patient presents with a chief complaint of epigastric pain and fever for the last 3 days.  Patient reports that his pain was in his epigastric region, was constant, 2/10 on the pain scale, no radiation, with no alleviating or aggravating symptoms.  Patient also reported nausea with his epigastric pain but denied any vomiting.  At present patient denies any abdominal pain or nausea.  Patient reports that he has had fevers for the past 3 days.  Patient ports that he has been taking Advil for his fevers.  Patient endorses minimal nonproductive cough, minimal generalized weakness.  No shortness of breath or difficulty breathing, no chest pain, no diarrhea, no difficulty urinating or dysuria, no headaches or syncope.  Patient denies receiving Covid or influenza vaccination.  This interview was conducted with the use of a Spanish interpreter.  HPI     Past Medical History:  Diagnosis Date  . BMI 27.0-27.9,adult     Patient Active Problem List   Diagnosis Date Noted  . BMI 27.0-27.9,adult     No past surgical history on file.     No family history on file.  Social History   Tobacco Use  . Smoking status: Never Smoker  . Smokeless tobacco: Never Used  Substance Use Topics  . Alcohol use: No    Comment: rarely  . Drug use: No    Home Medications Prior to Admission medications   Medication Sig Start Date End Date Taking? Authorizing Provider  ibuprofen (ADVIL) 200 MG tablet Take 200-400 mg by mouth as needed for fever, mild pain, moderate pain or headache.   Yes [provider]  carbamide peroxide (DEBROX) 6.5 % otic solution Place 10 drops into both ears 2 (two) times daily. Patient not taking: No sig  reported 03/19/16   Sam, Serena Y, PA-C  meclizine (ANTIVERT) 25 MG tablet Take 1 tablet (25 mg total) by mouth 3 (three) times daily as needed for dizziness. Patient not taking: No sig reported 07/02/16   Rolland Porter, MD  meclizine (ANTIVERT) 25 MG tablet Take 1 tablet (25 mg total) by mouth 3 (three) times daily as needed for dizziness. Patient not taking: No sig reported 07/02/16   Rolland Porter, MD  naproxen (NAPROSYN) 500 MG tablet Take 1 tablet (500 mg total) by mouth 2 (two) times daily. Patient not taking: No sig reported 03/19/16   Sam, Ace Gins, PA-C  ondansetron (ZOFRAN) 4 MG tablet Take 1 tablet (4 mg total) by mouth every 6 (six) hours. 10/29/20   Haskel Schroeder, PA-C    Allergies    Patient has no known allergies.  Review of Systems   Review of Systems  Constitutional: Positive for fever. Negative for chills.  Eyes: Negative for visual disturbance.  Respiratory: Positive for cough. Negative for shortness of breath.   Cardiovascular: Negative for chest pain and leg swelling.  Gastrointestinal: Positive for abdominal pain (epigastric) and nausea. Negative for abdominal distention, blood in stool, diarrhea and vomiting.  Genitourinary: Negative for difficulty urinating and dysuria.  Musculoskeletal: Negative for back pain and neck pain.  Skin: Negative for color change and rash.  Neurological: Negative for dizziness, syncope, light-headedness and headaches.  Psychiatric/Behavioral: Negative for confusion.  Physical Exam Updated Vital Signs BP 130/83   Pulse 97   Temp 99.4 F (37.4 C) (Oral)   Resp 12   Ht 5\' 5"  (1.651 m)   Wt 75 kg   SpO2 97%   BMI 27.51 kg/m   Physical Exam Vitals and nursing note reviewed.  Constitutional:      General: He is not in acute distress.    Appearance: He is not ill-appearing, toxic-appearing or diaphoretic.  HENT:     Head: Normocephalic.  Eyes:     General: No scleral icterus.       Right eye: No discharge.        Left  eye: No discharge.  Cardiovascular:     Rate and Rhythm: Normal rate and regular rhythm.     Heart sounds: Normal heart sounds.  Pulmonary:     Effort: Pulmonary effort is normal.     Breath sounds: Normal breath sounds.  Abdominal:     General: Abdomen is flat. Bowel sounds are normal. There is no distension.     Palpations: Abdomen is soft. There is no mass or pulsatile mass.     Tenderness: There is no abdominal tenderness. There is no guarding or rebound.  Musculoskeletal:     Cervical back: Neck supple.  Skin:    General: Skin is warm and dry.  Neurological:     General: No focal deficit present.     Mental Status: He is alert.  Psychiatric:        Behavior: Behavior is cooperative.     ED Results / Procedures / Treatments   Labs (all labs ordered are listed, but only abnormal results are displayed) Labs Reviewed  RESP PANEL BY RT-PCR (FLU A&B, COVID) ARPGX2 - Abnormal; Notable for the following components:      Result Value   SARS Coronavirus 2 by RT PCR POSITIVE (*)    All other components within normal limits  COMPREHENSIVE METABOLIC PANEL - Abnormal; Notable for the following components:   Glucose, Bld 105 (*)    ALT 45 (*)    All other components within normal limits  CBC - Abnormal; Notable for the following components:   WBC 3.7 (*)    Platelets 106 (*)    All other components within normal limits  LIPASE, BLOOD  URINALYSIS, ROUTINE W REFLEX MICROSCOPIC    EKG None  Radiology No results found.  Procedures Procedures (including critical care time)  Medications Ordered in ED Medications  acetaminophen (TYLENOL) tablet 650 mg (650 mg Oral Given 10/29/20 10/31/20)    ED Course  I have reviewed the triage vital signs and the nursing notes.  Pertinent labs & imaging results that were available during my care of the patient were reviewed by me and considered in my medical decision making (see chart for details).    MDM Rules/Calculators/A&P                           Alert 44 year old male no acute distress, nontoxic appearing.  Patient presents with complaint of epigastric pain and fever for the last 3 days.  At present patient denies any epigastric pain or nausea.  Labs were collected while patient was in waiting room.  Abdomen is soft, distended, nontender.  Lipase and hepatic function within normal limits.  Patient's epigastric pain and nausea likely related to his positive COVID-19 test.  Patient is unvaccinated.  No shortness of breath or difficulty breathing; patient is able  to speak in full complete sentences without difficulty.  SPO2 98% on room air, found to be 98% while ambulating.  Patient is in no acute distress, lungs clear to auscultation, and SPO2 maintained while ambulating, patient vitals stable, he is safe for discharge.    Based on BMI patient meets criteria for MAB infusion. Will send his information to MAB infusion team.  Patient was prescribed Zofran for nausea, and information for Tylenol for fever/pain control, told to isolate for the next 10 days and follow-up with primary care provider.  Discussed results, findings, treatment and follow up. Patient advised of return precautions. Patient verbalized understanding and agreed with plan.  Yonas Bunda was evaluated in Emergency Department on 10/29/2020 for the symptoms described in the history of present illness. He was evaluated in the context of the global COVID-19 pandemic, which necessitated consideration that the patient might be at risk for infection with the SARS-CoV-2 virus that causes COVID-19. Institutional protocols and algorithms that pertain to the evaluation of patients at risk for COVID-19 are in a state of rapid change based on information released by regulatory bodies including the CDC and federal and state organizations. These policies and algorithms were followed during the patient's care in the ED.   Final Clinical Impression(s) / ED Diagnoses Final  diagnoses:  COVID    Rx / DC Orders ED Discharge Orders         Ordered    ondansetron (ZOFRAN) 4 MG tablet  Every 6 hours        10/29/20 0952           Haskel Schroeder, PA-C 10/29/20 1012    Sabino Donovan, MD 10/29/20 [phone]

## 2020-10-29 NOTE — Discharge Instructions (Addendum)
You came to the emergency department today to be evaluated for your fever and stomach pain.  Your lab work and physical exam are reassuring that did not have any problems in your stomach that need further imaging.  You were found to be positive for COVID-19 today.  It is likely that this is causing your symptoms. Please read the following instructions below and attached paperwork for COVID-19.    Please read instructions below.  You can alternate Tylenol/acetaminophen and Advil/ibuprofen/Motrin every 4 hours for sore throat, body aches, headache or fever.  Drink plenty of water.  Use saline nasal spray for congestion. You can take zofran every 8 hours for nausea. Wash your hands frequently. Stay home and isolate yourself for minimum 14 days after your symptoms started, then at least 24 hours after fever-free in the absence of tylenol/ibuprofen and symptoms improving.  Follow up with your primary care provider. You should receive a call from the [facility] Antibody Infusion clinic for further instruction regarding appointment. Return to the ER for worsening shortness of breath, if you are unable to keep liquids down, or other concerning symptoms.

## 2020-10-29 NOTE — ED Notes (Signed)
Ambulated patient in room, patients O2 saturation remained at 98% while ambulating. Patient stated he felt okay while walking.

## 2020-10-29 NOTE — ED Notes (Signed)
Patient verbalized understanding of quarantine and management of pain/fever at home. Patient has no additional questions at this time and ambulated with steady and even gait toward the ED exit. No distress noted.

## 2020-10-30 ENCOUNTER — Telehealth: Payer: Self-pay | Admitting: Infectious Diseases

## 2020-10-30 NOTE — Telephone Encounter (Signed)
Called to Discuss with patient about Covid symptoms and the use of the monoclonal antibody infusion for those with mild to moderate Covid symptoms and at a high risk of hospitalization.     Pt appears to qualify for this infusion due to co-morbid conditions and/or a member of an at-risk group in accordance with the FDA Emergency Use Authorization.    [employer] Interpretor ID# [id number] for spanish interpretation   Symptom onset: 12/24 Vaccinated: no Qualified for Infusion: SVI risk score 4, BMI 27    He is "feeling good, but slight shoulder pain". This is intermittent and started a few days ago. He is otherwise having no problems with any COVID related symptoms. Politely declined treatment. Isolation precautions discussed.    Rexene Alberts, MSN, NP-C [facility]  [email or web address] Pager: [phone] Office: [phone] [facility] Main Line: [phone]

## 2020-11-04 ENCOUNTER — Ambulatory Visit (HOSPITAL_COMMUNITY)
Admission: EM | Admit: 2020-11-04 | Discharge: 2020-11-04 | Disposition: A | Discharge status: Home / Self Care | Payer: Self-pay

## 2020-11-04 ENCOUNTER — Encounter (HOSPITAL_COMMUNITY): Payer: Self-pay

## 2020-11-04 ENCOUNTER — Other Ambulatory Visit: Payer: Self-pay

## 2020-11-04 DIAGNOSIS — K219 Gastro-esophageal reflux disease without esophagitis: Secondary | ICD-10-CM

## 2020-11-04 MED ORDER — OMEPRAZOLE 20 MG PO CPDR: 20.0000 mg | DELAYED_RELEASE_CAPSULE | Freq: Every day | ORAL | 0 refills | Status: DC

## 2020-11-04 NOTE — ED Triage Notes (Signed)
Pt presents with epigastric, nausea and diarrhea ax 3 days. Reports feeling tingling sensation in the face and seeing blue lights since this morning. Denies chest pain, shortness of breath.   Pt reports he was seeing for abdominal pain 3 days ago and was prescribed ibuprofen.   Pt tested positive for COVID on 10/28/2020.

## 2020-11-04 NOTE — ED Provider Notes (Signed)
[facility]    CSN: [id number] Arrival date & time: 11/04/20  1020      History   Chief Complaint Chief Complaint  Patient presents with  . Abdominal Pain    + COVID 6 days ago  . Tingling  . Nausea  . Diarrhea    HPI Jason Haynes is a 45 y.o. male.   HPI   45 year old male here for evaluation of epigastric pain with nausea and diarrhea.  Patient reports that his epigastric pain started when he was diagnosed with Covid on 10/28/2020.  Patient reports that his diarrhea is off and on.  Patient states he is still running a fever that is responding to Tylenol.  Patient reports that he always has a sour taste in his mouth.  His epigastric pain increases when he is hungry and improves slightly after he eats.  Patient denies any burning in his throat, or sore throat in the morning.  Patient has not had any vomiting.  Patient was prescribed azithromycin which she is currently taking but he is unsure as to why.  There is no documentation in the chart of him being prescribed azithromycin.  Patient is also taking ibuprofen 800 mg.  Spanish interpreter Vicente Serene [phone] used for assessment and HPI.  Past Medical History:  Diagnosis Date  . BMI 27.0-27.9,adult     Patient Active Problem List   Diagnosis Date Noted  . BMI 27.0-27.9,adult     History reviewed. No pertinent surgical history.     Home Medications    Prior to Admission medications   Medication Sig Start Date End Date Taking? Authorizing Provider  benzonatate (TESSALON) 100 MG capsule Take by mouth 3 (three) times daily as needed for cough.   Yes [provider]  omeprazole (PRILOSEC) 20 MG capsule Take 1 capsule (20 mg total) by mouth daily. 11/04/20  Yes Becky Augusta, NP  carbamide peroxide (DEBROX) 6.5 % otic solution Place 10 drops into both ears 2 (two) times daily. Patient not taking: No sig reported 03/19/16   Sam, Serena Y, PA-C  ibuprofen (ADVIL) 200 MG tablet Take 200-400 mg by mouth as  needed for fever, mild pain, moderate pain or headache.    [provider]  meclizine (ANTIVERT) 25 MG tablet Take 1 tablet (25 mg total) by mouth 3 (three) times daily as needed for dizziness. Patient not taking: No sig reported 07/02/16   Rolland Porter, MD  meclizine (ANTIVERT) 25 MG tablet Take 1 tablet (25 mg total) by mouth 3 (three) times daily as needed for dizziness. Patient not taking: No sig reported 07/02/16   Rolland Porter, MD  naproxen (NAPROSYN) 500 MG tablet Take 1 tablet (500 mg total) by mouth 2 (two) times daily. Patient not taking: No sig reported 03/19/16   Sam, Ace Gins, PA-C  ondansetron (ZOFRAN) 4 MG tablet Take 1 tablet (4 mg total) by mouth every 6 (six) hours. 10/29/20   Haskel Schroeder, PA-C    Family History History reviewed. No pertinent family history.  Social History Social History   Tobacco Use  . Smoking status: Never Smoker  . Smokeless tobacco: Never Used  Substance Use Topics  . Alcohol use: No    Comment: rarely  . Drug use: No     Allergies   Patient has no known allergies.   Review of Systems Review of Systems  Constitutional: Positive for fever. Negative for activity change and appetite change.  Gastrointestinal: Positive for abdominal pain, diarrhea and nausea. Negative  for blood in stool and vomiting.  Skin: Negative for rash.  Hematological: Negative.   Psychiatric/Behavioral: Negative.      Physical Exam Triage Vital Signs ED Triage Vitals  Enc Vitals Group     BP 11/04/20 1136 137/78     Pulse Rate 11/04/20 1136 99     Resp 11/04/20 1136 20     Temp 11/04/20 1136 98.3 F (36.8 C)     Temp Source 11/04/20 1136 Oral     SpO2 11/04/20 1136 100 %     Weight --      Height --      Head Circumference --      Peak Flow --      Pain Score 11/04/20 1135 6     Pain Loc --      Pain Edu? --      Excl. in [place]? --    No data found.  Updated Vital Signs BP 137/78 (BP Location: Left Arm)   Pulse 99   Temp 98.3 F  (36.8 C) (Oral)   Resp 20   SpO2 100%   Visual Acuity Right Eye Distance:   Left Eye Distance:   Bilateral Distance:    Right Eye Near:   Left Eye Near:    Bilateral Near:     Physical Exam Vitals and nursing note reviewed.  Constitutional:      General: He is not in acute distress.    Appearance: He is well-developed. He is not toxic-appearing.  HENT:     Head: Normocephalic and atraumatic.  Cardiovascular:     Rate and Rhythm: Normal rate and regular rhythm.     Heart sounds: Normal heart sounds. No murmur heard. No gallop.   Pulmonary:     Effort: Pulmonary effort is normal.     Breath sounds: Normal breath sounds. No wheezing, rhonchi or rales.  Abdominal:     General: Abdomen is flat. Bowel sounds are normal. There is no distension.     Palpations: Abdomen is soft. There is no hepatomegaly or splenomegaly.     Tenderness: There is no abdominal tenderness.     Hernia: No hernia is present.  Skin:    General: Skin is warm and dry.     Capillary Refill: Capillary refill takes less than 2 seconds.     Findings: No erythema or rash.  Neurological:     General: No focal deficit present.     Mental Status: He is alert and oriented to person, place, and time.  Psychiatric:        Mood and Affect: Mood normal.        Behavior: Behavior normal.      UC Treatments / Results  Labs (all labs ordered are listed, but only abnormal results are displayed) Labs Reviewed - No data to display  EKG   Radiology No results found.  Procedures Procedures (including critical care time)  Medications Ordered in UC Medications - No data to display  Initial Impression / Assessment and Plan / UC Course  I have reviewed the triage vital signs and the nursing notes.  Pertinent labs & imaging results that were available during my care of the patient were reviewed by me and considered in my medical decision making (see chart for details).   Patient is here for evaluation of  what sounds like reflux disease.  Patient reports that he developed epigastric pain when he was first diagnosed with Covid.  Patient states that he has had a  sour taste in his mouth ever since but he denies sore throat or burning in his esophagus.  His symptoms increase when he is hungry and improve after he has eaten.  Patient's abdomen is soft, nontender, nondistended with positive bowel sounds in all 4 quadrants.  Will treat patient with Prilosec 20 mg daily and Pepcid 20 mg twice daily to help control acid production.  Patient advised to avoid acidic foods, spicy foods, or fried foods as these may make his symptoms worse.  Patient also advised to stop the ibuprofen but finished the azithromycin.  Both of these may be contributing to his symptoms.   Final Clinical Impressions(s) / UC Diagnoses   Final diagnoses:  Gastroesophageal reflux disease without esophagitis     Discharge Instructions     Stop the Ibuprofen and Benzonatate as these may be making your symptoms worse.  Avoid spicy foods, fried foods, or alcohol.  Take the Prilosec 20 mg daily in the morning and the Pepcid 20 mg twice daily.  Detenga el ibuprofeno y el benzonatato, ya que pueden empeorar sus sntomas.  Evite las comidas picantes, las frituras o el alcohol.  Tome Prilosec 20 mg al da por la maana y Pepcid 20 mg [employer].    ED Prescriptions    Medication Sig Dispense Auth. Provider   omeprazole (PRILOSEC) 20 MG capsule Take 1 capsule (20 mg total) by mouth daily. 30 capsule Becky Augusta, NP     PDMP not reviewed this encounter.   Becky Augusta, NP 11/04/20 1246

## 2020-11-04 NOTE — Discharge Instructions (Signed)
Stop the Ibuprofen and Benzonatate as these may be making your symptoms worse.  Avoid spicy foods, fried foods, or alcohol.  Take the Prilosec 20 mg daily in the morning and the Pepcid 20 mg twice daily.  Detenga el ibuprofeno y el benzonatato, ya que pueden empeorar sus sntomas.  Evite las comidas picantes, las frituras o el alcohol.  Tome Prilosec 20 mg al da por la maana y Pepcid 20 mg [employer].

## 2020-11-07 ENCOUNTER — Other Ambulatory Visit: Payer: Self-pay

## 2020-11-07 DIAGNOSIS — R1013 Epigastric pain: Secondary | ICD-10-CM | POA: Insufficient documentation

## 2020-11-08 ENCOUNTER — Other Ambulatory Visit: Payer: Self-pay

## 2020-11-08 ENCOUNTER — Encounter (HOSPITAL_COMMUNITY): Payer: Self-pay | Admitting: Emergency Medicine

## 2020-11-08 ENCOUNTER — Emergency Department (HOSPITAL_COMMUNITY)
Admission: EM | Admit: 2020-11-08 | Discharge: 2020-11-08 | Disposition: A | Discharge status: Home / Self Care | Payer: Self-pay | Attending: Emergency Medicine | Admitting: Emergency Medicine

## 2020-11-08 DIAGNOSIS — R1013 Epigastric pain: Secondary | ICD-10-CM

## 2020-11-08 LAB — URINALYSIS, ROUTINE W REFLEX MICROSCOPIC
Bilirubin Urine: NEGATIVE
Glucose, UA: NEGATIVE mg/dL
Hgb urine dipstick: NEGATIVE
Ketones, ur: NEGATIVE mg/dL
Leukocytes,Ua: NEGATIVE
Nitrite: NEGATIVE
Protein, ur: NEGATIVE mg/dL
Specific Gravity, Urine: 1.004 — ABNORMAL LOW (ref 1.005–1.030)
pH: 6 (ref 5.0–8.0)

## 2020-11-08 LAB — CBC
HCT: 41.7 % (ref 39.0–52.0)
Hemoglobin: 14.8 g/dL (ref 13.0–17.0)
MCH: 30 pg (ref 26.0–34.0)
MCHC: 35.5 g/dL (ref 30.0–36.0)
MCV: 84.6 fL (ref 80.0–100.0)
Platelets: 304 10*3/uL (ref 150–400)
RBC: 4.93 MIL/uL (ref 4.22–5.81)
RDW: 12.5 % (ref 11.5–15.5)
WBC: 6.2 10*3/uL (ref 4.0–10.5)
nRBC: 0 % (ref 0.0–0.2)

## 2020-11-08 LAB — COMPREHENSIVE METABOLIC PANEL
ALT: 91 U/L — ABNORMAL HIGH (ref 0–44)
AST: 40 U/L (ref 15–41)
Albumin: 3.6 g/dL (ref 3.5–5.0)
Alkaline Phosphatase: 73 U/L (ref 38–126)
Anion gap: 10 (ref 5–15)
BUN: 5 mg/dL — ABNORMAL LOW (ref 6–20)
CO2: 24 mmol/L (ref 22–32)
Calcium: 9.4 mg/dL (ref 8.9–10.3)
Chloride: 105 mmol/L (ref 98–111)
Creatinine, Ser: 0.79 mg/dL (ref 0.61–1.24)
GFR, Estimated: >60 mL/min (ref >60)
Glucose, Bld: 133 mg/dL — ABNORMAL HIGH (ref 70–99)
Potassium: 3.5 mmol/L (ref 3.5–5.1)
Sodium: 139 mmol/L (ref 135–145)
Total Bilirubin: 0.5 mg/dL (ref 0.3–1.2)
Total Protein: 6.9 g/dL (ref 6.5–8.1)

## 2020-11-08 LAB — LIPASE, BLOOD: Lipase: 35 U/L (ref 11–51)

## 2020-11-08 MED ORDER — ALUM & MAG HYDROXIDE-SIMETH 200-200-20 MG/5ML PO SUSP
30.0000 mL | Freq: Once | ORAL | Status: AC
  Administered 2020-11-08: 30 mL via ORAL
  Filled 2020-11-08: qty 30

## 2020-11-08 MED ORDER — OMEPRAZOLE 40 MG PO CPDR
40.0000 mg | DELAYED_RELEASE_CAPSULE | Freq: Every day | ORAL | 0 refills | Status: DC
Start: 2020-11-08 — End: 2020-11-28

## 2020-11-08 MED ORDER — FAMOTIDINE 20 MG PO TABS: 20.0000 mg | ORAL_TABLET | Freq: Three times a day (TID) | ORAL | 0 refills | Status: DC

## 2020-11-08 MED ORDER — LIDOCAINE VISCOUS HCL 2 % MT SOLN
15.0000 mL | Freq: Once | OROMUCOSAL | Status: AC
  Administered 2020-11-08: 15 mL via ORAL
  Filled 2020-11-08: qty 15

## 2020-11-08 MED ORDER — ACETAMINOPHEN 500 MG PO TABS
1000.0000 mg | ORAL_TABLET | Freq: Once | ORAL | Status: AC
  Administered 2020-11-08: 1000 mg via ORAL
  Filled 2020-11-08: qty 2

## 2020-11-08 NOTE — ED Provider Notes (Signed)
[facility] EMERGENCY DEPARTMENT Provider Note   CSN: [id number] Arrival date & time: 11/07/20  2338     History Chief Complaint  Patient presents with  . Abdominal Pain    Jason Haynes Jason Haynes is a 45 y.o. male presents to ER for evaluation of abdominal pain. This began over 3 weeks ago when he was diagnosed with COVID.  The pain is in the "mouth of the stomach" points to epigastrium. Constant, non radiating. It is worse when his stomach is empty and better if he eats something.  Pain is lingering throughout the day.  Does not completely go away. Reports for the last 3 weeks he has had intermittent looser stools but no frank watery diarrhea, melena, blood in his stool.  Stool is brown. No diarrhea this past week. Has had some nausea but no vomiting.  Went to urgent care recently and told to take omeprazole and famotidine which she has been taken but has not found significant improvement in symptoms.  No fever.  No vomiting.  No cough, chest pain.  No urinary symptoms.  Also reports that ever since he was diagnosed with Covid 3 weeks ago he has noticed that his hands are shaky intermittently.  He feels like the virus is still in his body.  Doesn't feel back to his usual self. Has not been back to work in the last 3 weeks and is asking when he can return to work.  Feels like if he is able to return to work his mind will be "clear" and he will feel better.  Denies significant alcohol use.  Used to drink alcohol frequently but quit 3 years ago.  Denies significant withdrawal symptoms in the past, seizures.  Denies significant anxiety currently.  Reports drinking energy drinks daily.  Was taking ibuprofen during the week that he was diagnosed with Covid but has since stopped because he was told that this would hurt his stomach.  HPI     Past Medical History:  Diagnosis Date  . BMI 27.0-27.9,adult     Patient Active Problem List   Diagnosis Date Noted  . BMI 27.0-27.9,adult      History reviewed. No pertinent surgical history.     No family history on file.  Social History   Tobacco Use  . Smoking status: Never Smoker  . Smokeless tobacco: Never Used  Substance Use Topics  . Alcohol use: No    Comment: rarely  . Drug use: No    Home Medications Prior to Admission medications   Medication Sig Start Date End Date Taking? Authorizing Provider  famotidine (PEPCID) 20 MG tablet Take 1 tablet (20 mg total) by mouth every 8 (eight) hours. Take before meals morning, afternoon and night 11/08/20  Yes Zanyah Lentsch J, PA-C  benzonatate (TESSALON) 100 MG capsule Take by mouth 3 (three) times daily as needed for cough.    [provider]  carbamide peroxide (DEBROX) 6.5 % otic solution Place 10 drops into both ears 2 (two) times daily. Patient not taking: No sig reported 03/19/16   Sam, Serena Y, PA-C  ibuprofen (ADVIL) 200 MG tablet Take 200-400 mg by mouth as needed for fever, mild pain, moderate pain or headache.    [provider]  meclizine (ANTIVERT) 25 MG tablet Take 1 tablet (25 mg total) by mouth 3 (three) times daily as needed for dizziness. Patient not taking: No sig reported 07/02/16   Tanna Furry, MD  meclizine (ANTIVERT) 25 MG tablet Take 1 tablet (  25 mg total) by mouth 3 (three) times daily as needed for dizziness. Patient not taking: No sig reported 07/02/16   Rolland Porter, MD  naproxen (NAPROSYN) 500 MG tablet Take 1 tablet (500 mg total) by mouth 2 (two) times daily. Patient not taking: No sig reported 03/19/16   Sam, Serena Y, PA-C  omeprazole (PRILOSEC) 40 MG capsule Take 1 capsule (40 mg total) by mouth daily. Take in the morning on an empty stomach 15-20 min before eating or drinking, daily 11/08/20 12/08/20  Liberty Handy, PA-C  ondansetron (ZOFRAN) 4 MG tablet Take 1 tablet (4 mg total) by mouth every 6 (six) hours. 10/29/20   Haskel Schroeder, PA-C    Allergies    Patient has no known allergies.  Review of Systems    Review of Systems  Gastrointestinal: Positive for abdominal pain, diarrhea (intermittent) and nausea.  Neurological: Positive for tremors.  All other systems reviewed and are negative.   Physical Exam Updated Vital Signs BP 125/83   Pulse (!) 102   Temp (!) 97.4 F (36.3 C) (Oral)   Resp 16   SpO2 99%   Physical Exam Vitals and nursing note reviewed.  Constitutional:      Appearance: He is well-developed.     Comments: Non toxic.  HENT:     Head: Normocephalic and atraumatic.     Nose: Nose normal.  Eyes:     Conjunctiva/sclera: Conjunctivae normal.  Cardiovascular:     Rate and Rhythm: Normal rate and regular rhythm.     Heart sounds: Normal heart sounds.  Pulmonary:     Effort: Pulmonary effort is normal.     Breath sounds: Normal breath sounds.  Abdominal:     General: Bowel sounds are normal.     Palpations: Abdomen is soft.     Tenderness: There is abdominal tenderness in the epigastric area.     Comments: No G/R/R. No suprapubic or CVA tenderness. Negative Murphy's and McBurney's  Musculoskeletal:        General: Normal range of motion.     Cervical back: Normal range of motion.  Skin:    General: Skin is warm and dry.     Capillary Refill: Capillary refill takes less than 2 seconds.  Neurological:     Mental Status: He is alert.     Comments: No tremors noted on hands/arms   Psychiatric:        Behavior: Behavior normal.     ED Results / Procedures / Treatments   Labs (all labs ordered are listed, but only abnormal results are displayed) Labs Reviewed  COMPREHENSIVE METABOLIC PANEL - Abnormal; Notable for the following components:      Result Value   Glucose, Bld 133 (*)    BUN 5 (*)    ALT 91 (*)    All other components within normal limits  URINALYSIS, ROUTINE W REFLEX MICROSCOPIC - Abnormal; Notable for the following components:   Color, Urine STRAW (*)    Specific Gravity, Urine 1.004 (*)    All other components within normal limits   LIPASE, BLOOD  CBC    EKG None  Radiology No results found.  Procedures Procedures (including critical care time)  Medications Ordered in ED Medications  alum & mag hydroxide-simeth (MAALOX/MYLANTA) 200-200-20 MG/5ML suspension 30 mL (30 mLs Oral Given 11/08/20 1018)    And  lidocaine (XYLOCAINE) 2 % viscous mouth solution 15 mL (15 mLs Oral Given 11/08/20 1019)  acetaminophen (TYLENOL) tablet 1,000 mg (1,000  mg Oral Given 11/08/20 1019)    ED Course  I have reviewed the triage vital signs and the nursing notes.  Pertinent labs & imaging results that were available during my care of the patient were reviewed by me and considered in my medical decision making (see chart for details).    MDM Rules/Calculators/A&P                           45 y.o. yo with chief complaint of 3 weeks of epigastric abdominal pain, nausea, intermittent diarrhea.  Symptoms began when he was diagnosed with Covid over 3 weeks ago.  Has noticed intermittent shakiness of his hands.  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  Was taking ibuprofen the first week of Covid.  Previous medical records available, triage and nursing notes reviewed to obtain more history and assist with MDM  Additional information obtained from urgent care note  Chief complain involves an extensive number of treatment options and is a complaint that carries with it a high risk of complications and morbidity and mortality.    Differential diagnosis: Highest on differential is GERD, gastritis, PUD.  Given chronicity of pain, diarrhea for the last 3 weeks acute cholecystitis, pancreatitis, diverticulitis considered but highly unlikely.  He could be having lingering Covid symptoms or other viral syndrome.  He has no radiation of pain into the chest, shortness of breath.  ER lab work and imaging ordered by triage RN and me, as above  I have personally visualized and interpreted ER diagnostic work up including labs  and imaging.    Labs reveal -vastly reassuring.  ALT 91.  Other LFTs, lipase, WBC and electrolytes normal.  Urinalysis without infection, RBCs.  Imaging reveals -I do not think emergent imaging is necessary today.  He has benign abdominal exam without peritonitis.  Labs are reassuring.  Vital signs normal without fever.  He had transient tachycardia that resolved without intervention.  Medications ordered -GI cocktail, Tylenol  Overall, clinical presentation and ER work up most suggestive of GI process like gastritis, GERD, PUD given history of recent ibuprofen use, remote alcohol use, location of pain and chronicity of symptoms.  Will increase patient's PPI and H2 blocker.  Discussed at length diet modifications, avoidance of NSAIDs.  He was given [facility] and recommended follow-up in the next week.  Return precautions discussed.  Patient advised that he can resume essential work duties since his Covid illness was more than 3 weeks ago.  In regards to his occasional, intermittent hand shakiness/tremors unclear etiology of this.  Electrolytes are normal.  He has no decreased numbness or pain associated with it.  Does report energy drink use, discussed importance of avoiding this.  Follow-up with PCP.  Final Clinical Impression(s) / ED Diagnoses Final diagnoses:  Epigastric abdominal pain    Rx / DC Orders ED Discharge Orders         Ordered    omeprazole (PRILOSEC) 40 MG capsule  Daily        11/08/20 0949    famotidine (PEPCID) 20 MG tablet  Every 8 hours        11/08/20 0949           Liberty Handy, PA-C 11/08/20 1135    Benjiman Core, MD 11/08/20 1558

## 2020-11-08 NOTE — ED Triage Notes (Signed)
Patient is 3 weeks post Covid, continues with nausea, abdominal pain and some mild diarrhea.  Patient states that he feels like the virus comes down his body and goes into his stomach and causes pain.

## 2020-11-08 NOTE — Discharge Instructions (Signed)
I think your symptoms are from gastritis, acid reflux or an ulcer  All of these conditions are treated the same way  I have changed your medicines   Take 40 mg omeprazole when you wake up on an empty stomach 15-20 min before meals  Take 20 mg famotidine before meals morning, afternoon, evening  Do not take any medicine with ibuprofen or aspirin in it. Do not drink alcohol. Do not drink or eat anything acidic or fatty (coffee, energy drinks, spicy greasy food)  Take [phone] mg acetaminophen for pain  You can return to work 10 days after the first day of COVID symptoms as long as you don't have a fever and your symptoms are improving  Return for fever greater than 100.21F, chest pain, shortness of breath, worsening constant abdominal pain, black or bloody stools  [facility] and make an appointment for re-evaluation in 1 week. They may refer you to gastroenterology if your symptoms have not improved

## 2020-11-08 NOTE — ED Notes (Signed)
ED Provider at bedside. 

## 2020-11-11 ENCOUNTER — Other Ambulatory Visit: Payer: Self-pay

## 2020-11-11 ENCOUNTER — Emergency Department (HOSPITAL_COMMUNITY)
Admission: EM | Admit: 2020-11-11 | Discharge: 2020-11-11 | Disposition: A | Discharge status: Home / Self Care | Payer: Self-pay | Attending: Emergency Medicine | Admitting: Emergency Medicine

## 2020-11-11 ENCOUNTER — Encounter (HOSPITAL_COMMUNITY): Payer: Self-pay | Admitting: Emergency Medicine

## 2020-11-11 DIAGNOSIS — R1013 Epigastric pain: Secondary | ICD-10-CM | POA: Insufficient documentation

## 2020-11-11 LAB — COMPREHENSIVE METABOLIC PANEL
ALT: 59 U/L — ABNORMAL HIGH (ref 0–44)
AST: 31 U/L (ref 15–41)
Albumin: 3.8 g/dL (ref 3.5–5.0)
Alkaline Phosphatase: 88 U/L (ref 38–126)
Anion gap: 10 (ref 5–15)
BUN: 14 mg/dL (ref 6–20)
CO2: 25 mmol/L (ref 22–32)
Calcium: 9.8 mg/dL (ref 8.9–10.3)
Chloride: 101 mmol/L (ref 98–111)
Creatinine, Ser: 0.85 mg/dL (ref 0.61–1.24)
GFR, Estimated: >60 mL/min (ref >60)
Glucose, Bld: 143 mg/dL — ABNORMAL HIGH (ref 70–99)
Potassium: 4.3 mmol/L (ref 3.5–5.1)
Sodium: 136 mmol/L (ref 135–145)
Total Bilirubin: 0.5 mg/dL (ref 0.3–1.2)
Total Protein: 7.4 g/dL (ref 6.5–8.1)

## 2020-11-11 LAB — CBC
HCT: 45.3 % (ref 39.0–52.0)
Hemoglobin: 15.2 g/dL (ref 13.0–17.0)
MCH: 29 pg (ref 26.0–34.0)
MCHC: 33.6 g/dL (ref 30.0–36.0)
MCV: 86.5 fL (ref 80.0–100.0)
Platelets: 354 10*3/uL (ref 150–400)
RBC: 5.24 MIL/uL (ref 4.22–5.81)
RDW: 12.6 % (ref 11.5–15.5)
WBC: 8.2 10*3/uL (ref 4.0–10.5)
nRBC: 0 % (ref 0.0–0.2)

## 2020-11-11 LAB — URINALYSIS, ROUTINE W REFLEX MICROSCOPIC
Bilirubin Urine: NEGATIVE
Glucose, UA: NEGATIVE mg/dL
Hgb urine dipstick: NEGATIVE
Ketones, ur: NEGATIVE mg/dL
Leukocytes,Ua: NEGATIVE
Nitrite: NEGATIVE
Protein, ur: NEGATIVE mg/dL
Specific Gravity, Urine: 1.012 (ref 1.005–1.030)
pH: 5 (ref 5.0–8.0)

## 2020-11-11 LAB — LIPASE, BLOOD: Lipase: 38 U/L (ref 11–51)

## 2020-11-11 MED ORDER — LIDOCAINE VISCOUS HCL 2 % MT SOLN
15.0000 mL | Freq: Once | OROMUCOSAL | Status: AC
  Administered 2020-11-11: 15 mL via ORAL
  Filled 2020-11-11: qty 15

## 2020-11-11 MED ORDER — LACTATED RINGERS IV BOLUS
1000.0000 mL | Freq: Once | INTRAVENOUS | Status: AC
  Administered 2020-11-11: 1000 mL via INTRAVENOUS

## 2020-11-11 MED ORDER — SUCRALFATE 1 G PO TABS: 1.0000 g | ORAL_TABLET | Freq: Three times a day (TID) | ORAL | 0 refills | Status: DC

## 2020-11-11 MED ORDER — ALUM & MAG HYDROXIDE-SIMETH 200-200-20 MG/5ML PO SUSP
30.0000 mL | Freq: Once | ORAL | Status: AC
  Administered 2020-11-11: 30 mL via ORAL
  Filled 2020-11-11: qty 30

## 2020-11-11 NOTE — ED Notes (Signed)
Dc instructions reviewed with pt. PT verbalized understanding. PT DC 

## 2020-11-11 NOTE — ED Provider Notes (Signed)
[facility] EMERGENCY DEPARTMENT Provider Note   CSN: [id number] Arrival date & time: 11/11/20  4174     History Chief Complaint  Patient presents with  . Abdominal Pain    Jason Haynes is a 45 y.o. male with a past medical history of COVID-19 with symptoms starting approximately 10/25/2020, who presents today for evaluation of epigastric abdominal pain.  He had diarrhea was sick with Covid and reports that has improved.  His pain waxes and wanes.  He states that his pain is made worse with eating foods in general however he notes sometimes if the food is very bland that will improve his pain.  He denies any fevers, cough, chest pain or shortness of breath.  He reports that he has not had any leg swelling.  No hematemesis.  No continued cough.  He states that he is currently taking Pepcid and Prilosec however he continues to have pain.    He does report that when he had COVID he was taking ibuprofen frequently to help with the pain.  He denies current alcohol use.  He was seen for the same thing on 11/04/20 and 11/08/20.  He denies relief of his symptoms.    He reports that he has not been drinking much, today all day has only had 12 ounces of water.  He states that when he drinks water he stops because he feels fully very quickly and reports feeling dehydrated.     HPI     Past Medical History:  Diagnosis Date  . BMI 27.0-27.9,adult     Patient Active Problem List   Diagnosis Date Noted  . BMI 27.0-27.9,adult     History reviewed. No pertinent surgical history.     No family history on file.  Social History   Tobacco Use  . Smoking status: Never Smoker  . Smokeless tobacco: Never Used  Substance Use Topics  . Alcohol use: No    Comment: rarely  . Drug use: No    Home Medications Prior to Admission medications   Medication Sig Start Date End Date Taking? Authorizing Provider  sucralfate (CARAFATE) 1 g tablet Take 1 tablet (1 g total) by mouth  4 (four) times daily -  with meals and at bedtime for 21 days. 11/11/20 12/02/20 Yes Cristina Gong, PA-C  benzonatate (TESSALON) 100 MG capsule Take by mouth 3 (three) times daily as needed for cough.    [provider]  famotidine (PEPCID) 20 MG tablet Take 1 tablet (20 mg total) by mouth every 8 (eight) hours. Take before meals morning, afternoon and night 11/08/20   Liberty Handy, PA-C  omeprazole (PRILOSEC) 40 MG capsule Take 1 capsule (40 mg total) by mouth daily. Take in the morning on an empty stomach 15-20 min before eating or drinking, daily 11/08/20 12/08/20  Liberty Handy, PA-C  ondansetron (ZOFRAN) 4 MG tablet Take 1 tablet (4 mg total) by mouth every 6 (six) hours. 10/29/20   Haskel Schroeder, PA-C    Allergies    Patient has no known allergies.  Review of Systems   Review of Systems  Constitutional: Negative for chills and fever.  HENT: Negative for congestion.   Respiratory: Negative for cough, shortness of breath and wheezing.   Cardiovascular: Negative for chest pain.  Gastrointestinal: Positive for abdominal pain (Epigastric). Negative for anal bleeding, diarrhea, nausea and vomiting.  Genitourinary: Negative for dysuria and urgency.  Musculoskeletal: Negative for back pain and neck pain.  Skin: Negative  for color change and rash.  Neurological: Negative for weakness and headaches.  Psychiatric/Behavioral: Negative for confusion and self-injury.  All other systems reviewed and are negative.   Physical Exam Updated Vital Signs BP 116/69 (BP Location: Left Arm)   Pulse 96   Temp 98.7 F (37.1 C) (Oral)   Resp 18   SpO2 96%   Physical Exam Vitals and nursing note reviewed.  Constitutional:      General: He is not in acute distress.    Appearance: He is well-developed and well-nourished. He is not diaphoretic.  HENT:     Head: Normocephalic and atraumatic.  Eyes:     General: No scleral icterus.       Right eye: No discharge.        Left  eye: No discharge.     Conjunctiva/sclera: Conjunctivae normal.  Cardiovascular:     Rate and Rhythm: Normal rate and regular rhythm.     Heart sounds: No murmur heard.   Pulmonary:     Effort: Pulmonary effort is normal. No respiratory distress.     Breath sounds: Normal breath sounds. No stridor.  Abdominal:     General: There is no distension.     Palpations: Abdomen is soft.     Tenderness: There is abdominal tenderness in the epigastric area. There is no guarding or rebound.     Hernia: No hernia is present.  Musculoskeletal:        General: No deformity or edema.     Cervical back: Normal range of motion and neck supple.  Skin:    General: Skin is warm and dry.  Neurological:     General: No focal deficit present.     Mental Status: He is alert.     Cranial Nerves: No cranial nerve deficit.     Motor: No abnormal muscle tone.  Psychiatric:        Mood and Affect: Mood and affect and mood normal.        Behavior: Behavior normal.     ED Results / Procedures / Treatments   Labs (all labs ordered are listed, but only abnormal results are displayed) Labs Reviewed  COMPREHENSIVE METABOLIC PANEL - Abnormal; Notable for the following components:      Result Value   Glucose, Bld 143 (*)    ALT 59 (*)    All other components within normal limits  LIPASE, BLOOD  CBC  URINALYSIS, ROUTINE W REFLEX MICROSCOPIC    EKG None  Radiology No results found.  Procedures Procedures (including critical care time) ED Peripheral IV (Provider)  Date/Time: 11/11/2020 7:45 PM Performed by: Cristina Gong, PA-C Authorized by: Cristina Gong, PA-C   Procedure details:    Indications: hydration,     Skin Prep: chlorhexidine gluconate     Location:  Right hand   Angiocath:  20 G   Bedside Ultrasound Guided: No     Patient tolerated procedure without complications: Yes     Dressing applied: Yes   Got flash, Flushed easily.    Medications Ordered in ED Medications   lactated ringers bolus 1,000 mL (0 mLs Intravenous Stopped 11/11/20 2108)  alum & mag hydroxide-simeth (MAALOX/MYLANTA) 200-200-20 MG/5ML suspension 30 mL (30 mLs Oral Given 11/11/20 2104)    And  lidocaine (XYLOCAINE) 2 % viscous mouth solution 15 mL (15 mLs Oral Given 11/11/20 2104)    ED Course  I have reviewed the triage vital signs and the nursing notes.  Pertinent labs & imaging  results that were available during my care of the patient were reviewed by me and considered in my medical decision making (see chart for details).  Clinical Course as of 11/11/20 2248  Wynelle Link Nov 11, 2020  1936 Patients primary RN is occupied with a sick patient.  I started his IV and gave him water and crackers.  [EH]    Clinical Course User Index [EH] Norman Clay   MDM Rules/Calculators/A&P                         Jason Haynes was evaluated in Emergency Department on 11/11/2020 for the symptoms described in the history of present illness. He was evaluated in the context of the global COVID-19 pandemic, which necessitated consideration that the patient might be at risk for infection with the SARS-CoV-2 virus that causes COVID-19. Institutional protocols and algorithms that pertain to the evaluation of patients at risk for COVID-19 are in a state of rapid change based on information released by regulatory bodies including the CDC and federal and state organizations. These policies and algorithms were followed during the patient's care in the ED.  Patient is a 45 year old Spanish-speaking man who presents today for evaluation of epigastric abdominal pain.  This is been on going since he had covid.  He was taking ibuprofen when he had covid.    He has been taking pepcid and prilosec with out relief.  He was tachycardic on arrival.  His abdomen is only tender in the epigastrium.  He is afebrile.  He does report generally poor water intake and thinks he may be dehydrated.  Patient does not have a  significant leukocytosis or anemia, CBC, lipase, CMP are all unremarkable.  UA without evidence of infection.  Patient denies specific chest pain or shortness of breath.  EKG without obvious ischemia. While referred intrathoracic pain is considered, given the evening changes his symptoms I suspect that this is gastritis from being sick with Covid, combined with NSAID use.  He is given 1 L of IV fluids to help rehydrate even though his tachycardia had proved without specific treatment.  If he had a surgical cause of his symptoms at this point I would expect him to have leukocytosis or more severe symptoms.   He is treated with GI cocktail and given a perscription to add carafate into his medications.  Additionally we discussed, through professional Spanish-speaking medical interpreter, the importance of GI follow-up.  Patient was able to tolerate crackers and water while in the emergency room without significant difficulty.  Return precautions were discussed with patient who states their understanding.  At the time of discharge patient denied any unaddressed complaints or concerns.  Patient is agreeable for discharge home.  Note: Portions of this report may have been transcribed using voice recognition software. Every effort was made to ensure accuracy; however, inadvertent computerized transcription errors may be present  Final Clinical Impression(s) / ED Diagnoses Final diagnoses:  Epigastric pain    Rx / DC Orders ED Discharge Orders         Ordered    sucralfate (CARAFATE) 1 g tablet  3 times daily with meals & bedtime        11/11/20 2101           Cristina Gong, PA-C 11/11/20 2248    Melene Plan, DO 11/11/20 2308

## 2020-11-11 NOTE — ED Triage Notes (Signed)
C/o abd pain with diarrhea x 5 days.  Denies nausea and vomiting.  States he has been seen in ED for same post-COVID.

## 2020-11-12 ENCOUNTER — Encounter: Payer: Self-pay | Admitting: Physician Assistant

## 2020-11-28 ENCOUNTER — Ambulatory Visit (INDEPENDENT_AMBULATORY_CARE_PROVIDER_SITE_OTHER): Payer: Self-pay | Admitting: Physician Assistant

## 2020-11-28 ENCOUNTER — Encounter: Payer: Self-pay | Admitting: Physician Assistant

## 2020-11-28 VITALS — BP 119/80 | HR 90 | Ht 64.0 in | Wt 169.0 lb

## 2020-11-28 DIAGNOSIS — R1013 Epigastric pain: Secondary | ICD-10-CM

## 2020-11-28 DIAGNOSIS — K219 Gastro-esophageal reflux disease without esophagitis: Secondary | ICD-10-CM

## 2020-11-28 MED ORDER — OMEPRAZOLE 40 MG PO CPDR: 40.0000 mg | DELAYED_RELEASE_CAPSULE | Freq: Two times a day (BID) | ORAL | 3 refills | Status: AC

## 2020-11-28 NOTE — Progress Notes (Signed)
Reviewed and agree with management plan.  Arlett Goold T. Shemeka Wardle, MD FACG [phone]  

## 2020-11-28 NOTE — Patient Instructions (Signed)
Aumente su omeprazol a dos veces [employer]. Tomar 30 minutos antes del desayuno y 30 minutos antes de la cena.  Se ha enviado una receta de omeprazol dos veces al da a su farmacia de [place].  Llame a la oficina si sus sntomas no mejoran.   Si tiene 64 aos o menos, su ndice de [employer] corporal debe estar entre 19 y 53. Su ndice de masa corporal es de 29,01 kg/m. Si esto est fuera del rango mencionado anteriormente, considere hacer un seguimiento con su proveedor de Marine scientist.    Thank you for choosing me and [facility].  Hyacinth Meeker PA

## 2020-11-28 NOTE — Progress Notes (Signed)
Chief Complaint: Follow-up ER visit for epigastric pain  HPI:    Jason Haynes is a 45 year old Spanish-speaking male with no significant past medical history, who presents to clinic today for follow-up after being seen in the ER on 3 separate occasions for epigastric pain/reflux symptoms.    11/11/2020 was patient's most recent visit.  At that time discussed that he had been diagnosed positive with Covid on 12/23, he was having epigastric abdominal pain.  This is made worse with eating in general, but sometimes bland foods seem to make it slightly better.  At that time was taking Pepcid and Prilosec but continues with pain.  Did report that when he had COVID he was taking ibuprofen frequently to help.  Labs showed an ALT minimally elevated at 59 and otherwise normal CMP, lipase, CBC and urinalysis.  At that time he was given GI cocktail and Carafate was added.    Today, patient is seen with the help of interpreter and explains that for the past 2 months he has had trouble with epigastric pain.  He has been seen in the ER 3 times, the last as above, explains that he has stayed on Omeprazole 20 mg over-the-counter for the past month but did not feel like any of the other medicines were helping, including Pepcid or Carafate, so he stopped these after a couple of weeks.  Explains that now he is no longer having the pain as much "just occasionally", and not as severe.  His main complaint today is that he does not feel like he has an appetite and gets full very fast and feels very bloated.  Currently staying on just vegetables, fruit and yogurt and plain tortillas as he was told by the ER that he "could not eat anything else".  Describes that he feels weak and sometimes gets dizzy throughout the day.  He works in [employer] so is finding this diet very hard to follow.    Denies fever, chills, weight loss, blood in his stool or symptoms that awaken him from sleep.  Past Medical History:  Diagnosis Date  . BMI  27.0-27.9,adult     No past surgical history on file.  Current Outpatient Medications  Medication Sig Dispense Refill  . benzonatate (TESSALON) 100 MG capsule Take by mouth 3 (three) times daily as needed for cough.    . famotidine (PEPCID) 20 MG tablet Take 1 tablet (20 mg total) by mouth every 8 (eight) hours. Take before meals morning, afternoon and night 30 tablet 0  . omeprazole (PRILOSEC) 40 MG capsule Take 1 capsule (40 mg total) by mouth daily. Take in the morning on an empty stomach 15-20 min before eating or drinking, daily 30 capsule 0  . ondansetron (ZOFRAN) 4 MG tablet Take 1 tablet (4 mg total) by mouth every 6 (six) hours. 12 tablet 0  . sucralfate (CARAFATE) 1 g tablet Take 1 tablet (1 g total) by mouth 4 (four) times daily -  with meals and at bedtime for 21 days. 84 tablet 0   No current facility-administered medications for this visit.    Allergies as of 11/28/2020  . (No Known Allergies)    No family history on file.  Social History   Socioeconomic History  . Marital status: Married    Spouse name: Not on file  . Number of children: Not on file  . Years of education: Not on file  . Highest education level: Not on file  Occupational History  . Not on file  Tobacco Use  . Smoking status: Never Smoker  . Smokeless tobacco: Never Used  Substance and Sexual Activity  . Alcohol use: No    Comment: rarely  . Drug use: No  . Sexual activity: Not on file  Other Topics Concern  . Not on file  Social History Narrative  . Not on file   Social Determinants of Health   Financial Resource Strain: Not on file  Food Insecurity: Not on file  Transportation Needs: Not on file  Physical Activity: Not on file  Stress: Not on file  Social Connections: Not on file  Intimate Partner Violence: Not on file    Review of Systems:    Constitutional: No weight loss, fever or chills Skin: No rash  Cardiovascular: No chest pain Respiratory: No SOB  Gastrointestinal:  See HPI and otherwise negative Genitourinary: No dysuria  Neurological: No headache, dizziness or syncope Musculoskeletal: No new muscle or joint pain Hematologic: No bleeding  Psychiatric: No history of depression or anxiety   Physical Exam:  Vital signs: BP 119/80   Pulse 90   Ht 5\' 4"  (1.626 m)   Wt 169 lb (76.7 kg)   SpO2 97%   BMI 29.01 kg/m   Constitutional:   Pleasant Hispanic male appears to be in NAD, Well developed, Well nourished, alert and cooperative Head:  Normocephalic and atraumatic. Eyes:   PEERL, EOMI. No icterus. Conjunctiva pink. Ears:  Normal auditory acuity. Neck:  Supple Throat: Oral cavity and pharynx without inflammation, swelling or lesion.  Respiratory: Respirations even and unlabored. Lungs clear to auscultation bilaterally.   No wheezes, crackles, or rhonchi.  Cardiovascular: Normal S1, S2. No MRG. Regular rate and rhythm. No peripheral edema, cyanosis or pallor.  Gastrointestinal:  Soft, nondistended,mild epigastric ttp, No rebound or guarding. Normal bowel sounds. No appreciable masses or hepatomegaly. Rectal:  Not performed.  Msk:  Symmetrical without gross deformities. Without edema, no deformity or joint abnormality.  Neurologic:  Alert and  oriented x4;  grossly normal neurologically.  Skin:   Dry and intact without significant lesions or rashes. Psychiatric: Demonstrates good judgement and reason without abnormal affect or behaviors.  RELEVANT LABS AND IMAGING: CBC    Component Value Date/Time   WBC 8.2 11/11/2020 1000   RBC 5.24 11/11/2020 1000   HGB 15.2 11/11/2020 1000   HCT 45.3 11/11/2020 1000   PLT 354 11/11/2020 1000   MCV 86.5 11/11/2020 1000   MCH 29.0 11/11/2020 1000   MCHC 33.6 11/11/2020 1000   RDW 12.6 11/11/2020 1000   LYMPHSABS 1.7 12/02/2011 1958   MONOABS 0.7 12/02/2011 1958   EOSABS 0.0 12/02/2011 1958   BASOSABS 0.0 12/02/2011 1958    CMP     Component Value Date/Time   NA 136 11/11/2020 1000   K 4.3  11/11/2020 1000   CL 101 11/11/2020 1000   CO2 25 11/11/2020 1000   GLUCOSE 143 (H) 11/11/2020 1000   BUN 14 11/11/2020 1000   CREATININE 0.85 11/11/2020 1000   CALCIUM 9.8 11/11/2020 1000   PROT 7.4 11/11/2020 1000   ALBUMIN 3.8 11/11/2020 1000   AST 31 11/11/2020 1000   ALT 59 (H) 11/11/2020 1000   ALKPHOS 88 11/11/2020 1000   BILITOT 0.5 11/11/2020 1000   GFRNONAA >60 11/11/2020 1000   GFRAA >90 12/02/2011 1958    Assessment: 1.  Epigastric pain: For the past 2 months, worse with eating, now the pain is better over the past couple of weeks with use of Omeprazole 20 mg  daily, but patient still feels early satiety and bloating; most likely gastritis+/-PUD 2.  GERD: With above 3.  Bloating: With above  Plan: 1.  Started the patient on Omeprazole 40 mg twice daily for the next 1 to 2 months.  Instructed him to take this 30 to 60 minutes for breakfast and dinner. 2.  Discussed an antireflux diet.  Hopefully when he starts to eat more food he will be able to work better and not feel as weak. 3.  Encouraged the patient to call us in 1 to 2 months if he is not better.  At that point we discussed an EGD.  He was assigned to Dr. Russella Dar today.  Hyacinth Meeker, PA-C [facility] 11/28/2020, 10:30 AM

## 2020-12-03 ENCOUNTER — Telehealth: Payer: Self-pay | Admitting: Physician Assistant

## 2020-12-03 DIAGNOSIS — K219 Gastro-esophageal reflux disease without esophagitis: Secondary | ICD-10-CM

## 2020-12-03 DIAGNOSIS — R1013 Epigastric pain: Secondary | ICD-10-CM

## 2020-12-03 NOTE — Telephone Encounter (Signed)
Spoke with patient, he states that he is still having epigastric pain, he denies any nausea or vomiting, he states that he has been taking the Omeprazole 40 mg as directed - advised that you wanted him to try this for at least a month but he is requesting that we schedule EGD at this time. He states that he has been avoiding spicy, acidic, caffeine and chocolate, anything to exacerbate his symptoms. Please advise, thanks.

## 2020-12-03 NOTE — Telephone Encounter (Signed)
Spoke with patient, patient is wanting soonest appt for EGD. Patient will come in tomorrow,12/04/20 at 9 AM to be instructed for procedure.  Called [facility], patient is scheduled for a COVID test on 12/04/20 at 10 AM. Advised them to call back with any questions.    EGD scheduled for Thursday, 12/06/20 at 10 AM, patient is aware that he will need a driver this day.   Patient verbalized understanding of all information and had no concerns at the end of the call.

## 2020-12-03 NOTE — Telephone Encounter (Signed)
Lm on vm for patient to return call 

## 2020-12-03 NOTE — Telephone Encounter (Signed)
Pt is requesting a call back from a nurse since his symptoms have not improved and he would like to discuss possibly scheduling an EGD now.

## 2020-12-03 NOTE — Telephone Encounter (Signed)
I am okay if he wants to have an EGD.  This would need to be in the [facility] with Dr. Russella Dar.  Thanks, J LL

## 2020-12-04 ENCOUNTER — Other Ambulatory Visit: Payer: Self-pay | Admitting: Gastroenterology

## 2020-12-04 LAB — SARS CORONAVIRUS 2 (TAT 6-24 HRS): SARS Coronavirus 2: NEGATIVE

## 2020-12-04 NOTE — Telephone Encounter (Signed)
Patient arrived at the office today for instructions/ to sign consent form for EGD. Provided patient with written consent form in spanish to review, witnessed patient sign consent form in the office. Pt denied any allergies to eggs or soy, pt denied taking any blood thinners, diet pills, or oxygen use. Reviewed EGD instructions with patient, answered all of patient's questions. Patient was to have COVID test after office visit today, patient was provided written instructions in Spanish to take home. Advised patient to call the office if he had any questions or concerns. Patient verbalized understanding of all information. Patient had no concerns at the end of the visit.

## 2020-12-06 ENCOUNTER — Other Ambulatory Visit: Payer: Self-pay

## 2020-12-06 ENCOUNTER — Ambulatory Visit (AMBULATORY_SURGERY_CENTER): Payer: Self-pay | Admitting: Gastroenterology

## 2020-12-06 ENCOUNTER — Encounter: Payer: Self-pay | Admitting: Gastroenterology

## 2020-12-06 VITALS — BP 121/72 | HR 82 | Temp 98.4°F | Resp 14 | Ht 64.0 in | Wt 169.0 lb

## 2020-12-06 DIAGNOSIS — K219 Gastro-esophageal reflux disease without esophagitis: Secondary | ICD-10-CM

## 2020-12-06 DIAGNOSIS — K2970 Gastritis, unspecified, without bleeding: Secondary | ICD-10-CM

## 2020-12-06 DIAGNOSIS — K2950 Unspecified chronic gastritis without bleeding: Secondary | ICD-10-CM

## 2020-12-06 DIAGNOSIS — R1013 Epigastric pain: Secondary | ICD-10-CM

## 2020-12-06 MED ORDER — DICYCLOMINE HCL 10 MG PO CAPS: 10.0000 mg | ORAL_CAPSULE | Freq: Three times a day (TID) | ORAL | 3 refills | Status: AC

## 2020-12-06 MED ORDER — SODIUM CHLORIDE 0.9 % IV SOLN: 500.0000 mL | Freq: Once | INTRAVENOUS | Status: DC

## 2020-12-06 NOTE — Patient Instructions (Signed)
Handout on gastritis given to you today  Pick up ( at your pharmacy)-Dicyclomine 10 mg by mouth before meals and at bedtime    Await biopsy results   Ultrasound will be scheduled for you by Dr Russella Dar - you will be called with date & time    USTED TUVO UN PROCEDIMIENTO ENDOSCPICO HOY EN EL [facility]:   Lea el informe del procedimiento que se le entreg para cualquier pregunta especfica sobre lo que se Dentist.  Si el informe del examen no responde a sus preguntas, por favor llame a su gastroenterlogo para aclararlo.  Si usted solicit que no se le den [employer] de lo que se Clinical cytogeneticist en su procedimiento al [employer] va a cuidar, entonces el informe del procedimiento se ha incluido en un sobre sellado para que usted lo revise despus cuando le sea ms conveniente.   LO QUE PUEDE ESPERAR: Algunas sensaciones de hinchazn en el abdomen.  Puede tener ms gases de lo normal.  El caminar puede ayudarle a eliminar el aire que se le puso en el tracto gastrointestinal durante el procedimiento y reducir la hinchazn.  Si le hicieron una endoscopia inferior (como una colonoscopia o una sigmoidoscopia flexible), podra notar manchas de sangre en las heces fecales o en el papel higinico.  Si se someti a una preparacin intestinal para su procedimiento, es posible que no tenga una evacuacin intestinal normal durante [employer].   Tenga en cuenta:  Es posible que note un poco de irritacin y congestin en la nariz o algn drenaje.  Esto es debido al oxgeno [employer] durante su procedimiento.  No hay que preocuparse y esto debe desaparecer ms o Regulatory affairs officer.   SNTOMAS PARA REPORTAR INMEDIATAMENTE:  Despus de una endoscopia inferior (colonoscopia o sigmoidoscopia flexible):  Cantidades excesivas de sangre en las heces fecales  Sensibilidad significativa o empeoramiento de los dolores abdominales   Hinchazn aguda del abdomen que antes no tena   Fiebre de  100F o ms   Despus de la endoscopia superior (EGD)  Vmitos de Retail buyer o material como caf molido   Dolor en el pecho o dolor debajo de los omplatos que antes no tena   Dolor o dificultad persistente para tragar  Falta de aire que antes no tena   Fiebre de 100F o ms  Heces fecales negras y pegajosas   Para asuntos urgentes o de Associate Professor, puede comunicarse con un gastroenterlogo a cualquier hora llamando al [phone].  DIETA:  Recomendamos una comida pequea al principio, pero luego puede continuar con su dieta normal.  Tome muchos lquidos, Tax adviser las bebidas alcohlicas durante 24 horas.    ACTIVIDAD:  Debe planear tomarse las cosas con calma por el resto del da y no debe CONDUCIR ni usar maquinaria pesada Patent examiner (debido a los medicamentos de sedacin utilizados durante el examen).     SEGUIMIENTO: Nuestro personal llamar al nmero que aparece en su historial al siguiente da hbil de su procedimiento para ver cmo se siente y para responder cualquier pregunta o inquietud que pueda tener con respecto a la informacin que se le dio despus del procedimiento. Si no podemos contactarle, le dejaremos un mensaje.  Sin embargo, si se siente bien y no tiene English as a second language teacher, no es necesario que nos devuelva la llamada.  Asumiremos que ha regresado a sus actividades diarias normales sin incidentes. Si se le tomaron algunas biopsias, le contactaremos por telfono o por carta en  las prximas 3 semanas.  Si no ha sabido [employer] biopsias en el transcurso de 3 semanas, por favor llmenos al [phone].   FIRMAS/CONFIDENCIALIDAD: Usted y/o el acompaante que le cuide han firmado documentos que se ingresarn en su historial mdico electrnico.  Estas firmas atestiguan el hecho de que la informacin anterior

## 2020-12-06 NOTE — Progress Notes (Signed)
Called to room to assist during endoscopic procedure.  Patient ID and intended procedure confirmed with present staff. Received instructions for my participation in the procedure from the performing physician.  

## 2020-12-06 NOTE — Progress Notes (Signed)
pt tolerated well. VSS. awake and to recovery. Report given to RN. Bite block used without trauma. 

## 2020-12-06 NOTE — Op Note (Signed)
[facility] Patient Name: Jason Haynes Procedure Date: 12/06/2020 9:47 AM MRN: [id number] Endoscopist: Meryl Dare , MD Age: 45 Referring MD:  Date of Birth: 1976/10/16 Gender: Male Account #: [id number] Procedure:                Upper GI endoscopy Indications:              Epigastric abdominal pain Medicines:                Monitored Anesthesia Care Procedure:                Pre-Anesthesia Assessment:                           - Prior to the procedure, a History and Physical                            was performed, and patient medications and                            allergies were reviewed. The patient's tolerance of                            previous anesthesia was also reviewed. The risks                            and benefits of the procedure and the sedation                            options and risks were discussed with the patient.                            All questions were answered, and informed consent                            was obtained. Prior Anticoagulants: The patient has                            taken no previous anticoagulant or antiplatelet                            agents. ASA Grade Assessment: II - A patient with                            mild systemic disease. After reviewing the risks                            and benefits, the patient was deemed in                            satisfactory condition to undergo the procedure.                           After obtaining informed consent, the endoscope was  passed under direct vision. Throughout the                            procedure, the patient's blood pressure, pulse, and                            oxygen saturations were monitored continuously. The                            Endoscope was introduced through the mouth, and                            advanced to the second part of duodenum. The upper                            GI endoscopy was accomplished  without difficulty.                            The patient tolerated the procedure well. Scope In: Scope Out: Findings:                 The examined esophagus was normal.                           Patchy mildly erythematous mucosa without bleeding                            was found in the gastric body. Biopsies were taken                            with a cold forceps for histology.                           The exam of the stomach was otherwise normal.                           The duodenal bulb and second portion of the                            duodenum were normal. Complications:            No immediate complications. Estimated Blood Loss:     Estimated blood loss was minimal. Impression:               - Normal esophagus.                           - Erythematous mucosa in the gastric body. Biopsied.                           - Normal duodenal bulb and second portion of the                            duodenum. Recommendation:           - Patient has a contact number available for  emergencies. The signs and symptoms of potential                            delayed complications were discussed with the                            patient. Return to normal activities tomorrow.                            Written discharge instructions were provided to the                            patient.                           - Resume previous diet.                           - Continue present medications.                           - Dicyclomine 10 mg po qid, ac & hs, #120, 2                            refills.                           - Await pathology results.                           - RUQ ultrasound at the next available appointment. Meryl Dare, MD 12/06/2020 10:09:45 AM This report has been signed electronically.

## 2020-12-07 ENCOUNTER — Other Ambulatory Visit: Payer: Self-pay

## 2020-12-07 ENCOUNTER — Telehealth: Payer: Self-pay

## 2020-12-07 DIAGNOSIS — R1013 Epigastric pain: Secondary | ICD-10-CM

## 2020-12-07 NOTE — Telephone Encounter (Signed)
Per procedure reports 2/3 patient to be scheduled for RUQ [place] ( ultrasound of the right upper quadrant) per Dr. Russella Dar.  Patient has been scheduled for 12/18/20 at 10:00 at [facility]. He will need to arrive at 9:45 and have nothing to eat or drink after midnight.    Lyman Bishop, [place], or [place].  Can you please contact the patient and notify him of the appointment??

## 2020-12-07 NOTE — Telephone Encounter (Signed)
Spoke with patient and  notified of appointment.

## 2020-12-10 ENCOUNTER — Telehealth: Payer: Self-pay | Admitting: *Deleted

## 2020-12-10 NOTE — Telephone Encounter (Signed)
Spoke with pt. He stated that he was able to return to his normal activities the day after his procedure without any issues. Pt did not have further questions.

## 2020-12-10 NOTE — Telephone Encounter (Signed)
  Follow up Call-  Call back number 12/06/2020  Post procedure Call Back phone  # [phone] wife  Permission to leave phone message Yes  Some recent data might be hidden     Phoned pt and was unable to communicate with patient.  Will call back this afternoon when someone that speaks spanish is available.

## 2020-12-18 ENCOUNTER — Ambulatory Visit (HOSPITAL_COMMUNITY)
Admission: RE | Admit: 2020-12-18 | Discharge: 2020-12-18 | Disposition: A | Discharge status: Home / Self Care | Payer: Self-pay | Source: Ambulatory Visit | Attending: Gastroenterology | Admitting: Gastroenterology

## 2020-12-18 ENCOUNTER — Other Ambulatory Visit: Payer: Self-pay

## 2020-12-18 DIAGNOSIS — R1013 Epigastric pain: Secondary | ICD-10-CM

## 2020-12-19 ENCOUNTER — Telehealth: Payer: Self-pay | Admitting: Gastroenterology

## 2020-12-19 NOTE — Telephone Encounter (Signed)
Left message to call back to give [place] results per [facility].

## 2020-12-21 ENCOUNTER — Encounter: Payer: Self-pay | Admitting: Gastroenterology

## 2020-12-26 ENCOUNTER — Encounter: Payer: Self-pay | Admitting: Gastroenterology

## 2021-02-15 ENCOUNTER — Encounter (HOSPITAL_COMMUNITY): Payer: Self-pay | Admitting: Pharmacy Technician

## 2021-02-15 ENCOUNTER — Emergency Department (HOSPITAL_COMMUNITY)
Admission: EM | Admit: 2021-02-15 | Discharge: 2021-02-15 | Disposition: A | Discharge status: Home / Self Care | Payer: Self-pay | Attending: Emergency Medicine | Admitting: Emergency Medicine

## 2021-02-15 DIAGNOSIS — N419 Inflammatory disease of prostate, unspecified: Secondary | ICD-10-CM

## 2021-02-15 DIAGNOSIS — N410 Acute prostatitis: Secondary | ICD-10-CM | POA: Insufficient documentation

## 2021-02-15 DIAGNOSIS — M545 Low back pain, unspecified: Secondary | ICD-10-CM | POA: Insufficient documentation

## 2021-02-15 DIAGNOSIS — K219 Gastro-esophageal reflux disease without esophagitis: Secondary | ICD-10-CM | POA: Insufficient documentation

## 2021-02-15 LAB — COMPREHENSIVE METABOLIC PANEL
ALT: 24 U/L (ref 0–44)
AST: 20 U/L (ref 15–41)
Albumin: 4.2 g/dL (ref 3.5–5.0)
Alkaline Phosphatase: 83 U/L (ref 38–126)
Anion gap: 6 (ref 5–15)
BUN: 12 mg/dL (ref 6–20)
CO2: 28 mmol/L (ref 22–32)
Calcium: 9.3 mg/dL (ref 8.9–10.3)
Chloride: 106 mmol/L (ref 98–111)
Creatinine, Ser: 0.89 mg/dL (ref 0.61–1.24)
GFR, Estimated: >60 mL/min (ref >60)
Glucose, Bld: 92 mg/dL (ref 70–99)
Potassium: 3.9 mmol/L (ref 3.5–5.1)
Sodium: 140 mmol/L (ref 135–145)
Total Bilirubin: 0.7 mg/dL (ref 0.3–1.2)
Total Protein: 7.1 g/dL (ref 6.5–8.1)

## 2021-02-15 LAB — CBC WITH DIFFERENTIAL/PLATELET
Abs Immature Granulocytes: 0.02 10*3/uL (ref 0.00–0.07)
Basophils Absolute: 0.1 10*3/uL (ref 0.0–0.1)
Basophils Relative: 1 %
Eosinophils Absolute: 0.1 10*3/uL (ref 0.0–0.5)
Eosinophils Relative: 1 %
HCT: 46.5 % (ref 39.0–52.0)
Hemoglobin: 15.9 g/dL (ref 13.0–17.0)
Immature Granulocytes: 0 %
Lymphocytes Relative: 39 %
Lymphs Abs: 1.8 10*3/uL (ref 0.7–4.0)
MCH: 30.1 pg (ref 26.0–34.0)
MCHC: 34.2 g/dL (ref 30.0–36.0)
MCV: 87.9 fL (ref 80.0–100.0)
Monocytes Absolute: 0.4 10*3/uL (ref 0.1–1.0)
Monocytes Relative: 9 %
Neutro Abs: 2.3 10*3/uL (ref 1.7–7.7)
Neutrophils Relative %: 50 %
Platelets: 190 10*3/uL (ref 150–400)
RBC: 5.29 MIL/uL (ref 4.22–5.81)
RDW: 12.4 % (ref 11.5–15.5)
WBC: 4.6 10*3/uL (ref 4.0–10.5)
nRBC: 0 % (ref 0.0–0.2)

## 2021-02-15 LAB — URINALYSIS, ROUTINE W REFLEX MICROSCOPIC
Bilirubin Urine: NEGATIVE
Glucose, UA: NEGATIVE mg/dL
Hgb urine dipstick: NEGATIVE
Ketones, ur: NEGATIVE mg/dL
Leukocytes,Ua: NEGATIVE
Nitrite: NEGATIVE
Protein, ur: NEGATIVE mg/dL
Specific Gravity, Urine: 1.029 (ref 1.005–1.030)
pH: 5 (ref 5.0–8.0)

## 2021-02-15 MED ORDER — LEVOFLOXACIN 500 MG PO TABS: 500.0000 mg | ORAL_TABLET | Freq: Every day | ORAL | 0 refills | Status: DC

## 2021-02-15 NOTE — Discharge Instructions (Addendum)
Please stop taking the Cipro Begin taking Levaquin Follow-up with urology in the next 1 to 2 weeks Return if you are any worsening symptoms especially fever or worsening pain.

## 2021-02-15 NOTE — ED Provider Notes (Signed)
Patient placed in Quick Look pathway, seen and evaluated   Chief Complaint: lower abdominal pain, back pain   HPI:   Pt reports pain in both sides of lower abdomen and pain in penis after ejaculation and rectal pain  ROS: no fever,   Physical Exam:   Gen: No distress  Neuro: Awake and Alert  Skin: Warm    Focused Exam: WDWN Tender mid lower back,  Lungs normal resp, normal heart rate    Initiation of care has begun. The patient has been counseled on the process, plan, and necessity for staying for the completion/evaluation, and the remainder of the medical screening examination   Osie Cheeks 02/15/21 1345    Koleen Distance, MD 02/15/21 1452

## 2021-02-15 NOTE — ED Provider Notes (Signed)
[facility] EMERGENCY DEPARTMENT Provider Note   CSN: [id number] Arrival date & time: 02/15/21  1313     History No chief complaint on file.   Jason Haynes is a 45 y.o. male.  HPI  Level 5 caveat secondary to language barrier History obtained through interpreter 45 year old male presents today complaining of low back pain.  Patient reports that he began having some pain in his low back in his lower abdomen that began approximately 8 days ago.  He has had pain with ejaculation and pain with intercourse during that time.  He does not report any change in his urinary habits although he has had some frequency for a while.  He denies any blood in his urine, pain with urination, or burning with urination.  He denies any fever but does endorse he has had some chills.  He was seen 2 days ago at a clinic and started on Cipro.  He has had a total of 3 doses of Cipro.  He feels that because his symptoms has not improved he is seeking medical evaluation again today.  He denies any headache, chest pain, nausea, vomiting, diarrhea, fever, or rashes. Patient reports sexual activity in a monogamous relationship with his wife.    Past Medical History:  Diagnosis Date  . BMI 27.0-27.9,adult   . GERD (gastroesophageal reflux disease)     Patient Active Problem List   Diagnosis Date Noted  . BMI 27.0-27.9,adult     History reviewed. No pertinent surgical history.     Family History  Family history unknown: Yes    Social History   Tobacco Use  . Smoking status: Never Smoker  . Smokeless tobacco: Never Used  Substance Use Topics  . Alcohol use: No    Comment: rarely  . Drug use: No    Home Medications Prior to Admission medications   Medication Sig Start Date End Date Taking? Authorizing Provider  dicyclomine (BENTYL) 10 MG capsule Take 1 capsule (10 mg total) by mouth 4 (four) times daily -  before meals and at bedtime. 12/06/20   Meryl Dare, MD  Chilton Si Tea,  Camellia sinensis, 1000 MG TABS Take by mouth. As needed to help with stomach issues. Patient not taking: Reported on 12/06/2020    [provider]  omeprazole (PRILOSEC) 40 MG capsule Take 1 capsule (40 mg total) by mouth in the morning and at bedtime. 11/28/20   Unk Lightning, PA    Allergies    Patient has no known allergies.  Review of Systems   Review of Systems  All other systems reviewed and are negative.   Physical Exam Updated Vital Signs BP 126/83 (BP Location: Right Arm)   Pulse 78   Temp 98.4 F (36.9 C) (Oral)   Resp 16   SpO2 100%   Physical Exam Vitals and nursing note reviewed.  Constitutional:      General: He is not in acute distress.    Appearance: Normal appearance. He is well-developed. He is not ill-appearing.  HENT:     Head: Normocephalic and atraumatic.     Right Ear: External ear normal.     Left Ear: External ear normal.     Nose: Nose normal.     Mouth/Throat:     Mouth: Mucous membranes are moist.  Neck:     Trachea: No tracheal deviation.  Cardiovascular:     Rate and Rhythm: Normal rate and regular rhythm.     Pulses: Normal pulses.  Pulmonary:     Effort: Pulmonary effort is normal.  Abdominal:     General: Abdomen is flat. Bowel sounds are normal. There is no distension.     Palpations: Abdomen is soft.     Tenderness: There is abdominal tenderness.     Comments: Mild suprapubic tenderness to palpate  Genitourinary:    Penis: Normal.      Testes: Normal.     Comments: Diffuse tenderness to palpation over slightly enlarged prostate Musculoskeletal:        General: Normal range of motion.     Cervical back: Normal range of motion.  Skin:    General: Skin is warm and dry.     Capillary Refill: Capillary refill takes less than 2 seconds.  Neurological:     Mental Status: He is alert and oriented to person, place, and time.  Psychiatric:        Behavior: Behavior normal.     ED Results / Procedures / Treatments    Labs (all labs ordered are listed, but only abnormal results are displayed) Labs Reviewed  CBC WITH DIFFERENTIAL/PLATELET  URINALYSIS, ROUTINE W REFLEX MICROSCOPIC  COMPREHENSIVE METABOLIC PANEL    EKG None  Radiology No results found.  Procedures Procedures   Medications Ordered in ED Medications - No data to display  ED Course  I have reviewed the triage vital signs and the nursing notes.  Pertinent labs & imaging results that were available during my care of the patient were reviewed by me and considered in my medical decision making (see chart for details).    MDM Rules/Calculators/A&P                         45 year old male who presents today complaining of pain in the lower abdomen which is worse with ejaculation.  On exam he is tender over his prostate.  Review of urine reveals no evidence of UTI.  Strongly suspect prostatitis.  Patient reports no high risk sexual behavior.  Report reports no discharge from penis.  Review of medications for uncomplicated prostatitis in a monogamous male with no high risk sexual activity in his age group should be from, Cipro, or Levaquin.  Reviews his Cipro prescription.  He was given only 1 week.  He is concerned because he has been 3 doses and is not any improvement plan Levaquin for the next 2 weeks.  Also plan referral to urology for follow-up.  Instructed patient through the interpreter.  He was given the opportunity to answer questions.  We discussed follow-up and return precautions and he voices understanding. Final Clinical Impression(s) / ED Diagnoses Final diagnoses:  Prostatitis, unspecified prostatitis type    Rx / DC Orders ED Discharge Orders         Ordered    levofloxacin (LEVAQUIN) 500 MG tablet  Daily        02/15/21 1526           Margarita Grizzle, MD 02/15/21 1527

## 2021-02-17 LAB — URINE CULTURE: Culture: NO GROWTH

## 2021-02-18 ENCOUNTER — Encounter (HOSPITAL_COMMUNITY): Payer: Self-pay | Admitting: *Deleted

## 2021-02-18 ENCOUNTER — Emergency Department (HOSPITAL_COMMUNITY): Payer: Self-pay

## 2021-02-18 ENCOUNTER — Other Ambulatory Visit: Payer: Self-pay

## 2021-02-18 ENCOUNTER — Emergency Department (HOSPITAL_COMMUNITY)
Admission: EM | Admit: 2021-02-18 | Discharge: 2021-02-18 | Disposition: A | Discharge status: Home / Self Care | Payer: Self-pay | Attending: Emergency Medicine | Admitting: Emergency Medicine

## 2021-02-18 DIAGNOSIS — Z8616 Personal history of COVID-19: Secondary | ICD-10-CM | POA: Insufficient documentation

## 2021-02-18 DIAGNOSIS — R1030 Lower abdominal pain, unspecified: Secondary | ICD-10-CM | POA: Insufficient documentation

## 2021-02-18 DIAGNOSIS — R5381 Other malaise: Secondary | ICD-10-CM | POA: Insufficient documentation

## 2021-02-18 DIAGNOSIS — R42 Dizziness and giddiness: Secondary | ICD-10-CM | POA: Insufficient documentation

## 2021-02-18 LAB — CBC WITH DIFFERENTIAL/PLATELET
Abs Immature Granulocytes: 0.02 10*3/uL (ref 0.00–0.07)
Basophils Absolute: 0 10*3/uL (ref 0.0–0.1)
Basophils Relative: 1 %
Eosinophils Absolute: 0 10*3/uL (ref 0.0–0.5)
Eosinophils Relative: 0 %
HCT: 47.8 % (ref 39.0–52.0)
Hemoglobin: 16.4 g/dL (ref 13.0–17.0)
Immature Granulocytes: 0 %
Lymphocytes Relative: 25 %
Lymphs Abs: 2 10*3/uL (ref 0.7–4.0)
MCH: 29.5 pg (ref 26.0–34.0)
MCHC: 34.3 g/dL (ref 30.0–36.0)
MCV: 86.1 fL (ref 80.0–100.0)
Monocytes Absolute: 0.5 10*3/uL (ref 0.1–1.0)
Monocytes Relative: 6 %
Neutro Abs: 5.2 10*3/uL (ref 1.7–7.7)
Neutrophils Relative %: 68 %
Platelets: 192 10*3/uL (ref 150–400)
RBC: 5.55 MIL/uL (ref 4.22–5.81)
RDW: 12.2 % (ref 11.5–15.5)
WBC: 7.7 10*3/uL (ref 4.0–10.5)
nRBC: 0 % (ref 0.0–0.2)

## 2021-02-18 LAB — COMPREHENSIVE METABOLIC PANEL
ALT: 23 U/L (ref 0–44)
AST: 19 U/L (ref 15–41)
Albumin: 4.2 g/dL (ref 3.5–5.0)
Alkaline Phosphatase: 85 U/L (ref 38–126)
Anion gap: 6 (ref 5–15)
BUN: 9 mg/dL (ref 6–20)
CO2: 27 mmol/L (ref 22–32)
Calcium: 9.5 mg/dL (ref 8.9–10.3)
Chloride: 106 mmol/L (ref 98–111)
Creatinine, Ser: 0.95 mg/dL (ref 0.61–1.24)
GFR, Estimated: >60 mL/min (ref >60)
Glucose, Bld: 118 mg/dL — ABNORMAL HIGH (ref 70–99)
Potassium: 3.4 mmol/L — ABNORMAL LOW (ref 3.5–5.1)
Sodium: 139 mmol/L (ref 135–145)
Total Bilirubin: 0.6 mg/dL (ref 0.3–1.2)
Total Protein: 7.3 g/dL (ref 6.5–8.1)

## 2021-02-18 LAB — URINALYSIS, ROUTINE W REFLEX MICROSCOPIC
Bilirubin Urine: NEGATIVE
Glucose, UA: NEGATIVE mg/dL
Hgb urine dipstick: NEGATIVE
Ketones, ur: NEGATIVE mg/dL
Leukocytes,Ua: NEGATIVE
Nitrite: NEGATIVE
Protein, ur: NEGATIVE mg/dL
Specific Gravity, Urine: 1.011 (ref 1.005–1.030)
pH: 5 (ref 5.0–8.0)

## 2021-02-18 LAB — LIPASE, BLOOD: Lipase: 31 U/L (ref 11–51)

## 2021-02-18 MED ORDER — IOHEXOL 300 MG/ML  SOLN
100.0000 mL | Freq: Once | INTRAMUSCULAR | Status: AC | PRN
  Administered 2021-02-18: 100 mL via INTRAVENOUS

## 2021-02-18 NOTE — Discharge Instructions (Signed)
You may take ibuprofen, available over the counter according to label instructions as needed for pain.

## 2021-02-18 NOTE — ED Notes (Signed)
Discharge instructions reviews with IPAD electronic spanish interpreter. Pt verbalized understanding of d/c instructions and follow up care. Pt A&Ox4, ambulatory at d/c with independent steady gait.

## 2021-02-18 NOTE — ED Provider Notes (Signed)
[facility] EMERGENCY DEPARTMENT Provider Note   CSN: [id number] Arrival date & time: 02/18/21  1536     History Chief Complaint  Patient presents with  . Abdominal Pain    Jason Haynes is a 45 y.o. male.  The history is provided by the patient and medical records. A language interpreter was used.  Abdominal Pain  Jason Haynes is a 45 y.o. male who presents to the Emergency Department complaining of abdominal pain. He presents to the ED complaining of 8-10 days of abdominal pain.  Pain is located in the lower abdomen and is constant in nature.  Pain is described as strong, at times radiates to the pelvis/groin.  Has anal burning.  Pain is worse with movement.    Had covid 4 months ago, since then sxs worsen.  Gets dizzy, worse vision. Cannot work.  Not sexually active.  Taking levaquin with no relief, started 4/15  Denies fever, vomiting, diarrhea, dysuria, discharge. Nausea, mild constipation a few days ago    Past Medical History:  Diagnosis Date  . BMI 27.0-27.9,adult   . GERD (gastroesophageal reflux disease)     Patient Active Problem List   Diagnosis Date Noted  . BMI 27.0-27.9,adult     History reviewed. No pertinent surgical history.     Family History  Family history unknown: Yes    Social History   Tobacco Use  . Smoking status: Never Smoker  . Smokeless tobacco: Never Used  Substance Use Topics  . Alcohol use: No    Comment: rarely  . Drug use: No    Home Medications Prior to Admission medications   Medication Sig Start Date End Date Taking? Authorizing Provider  dicyclomine (BENTYL) 10 MG capsule Take 1 capsule (10 mg total) by mouth 4 (four) times daily -  before meals and at bedtime. 12/06/20   Meryl Dare, MD  Chilton Si Tea, Camellia sinensis, 1000 MG TABS Take by mouth. As needed to help with stomach issues. Patient not taking: Reported on 12/06/2020    [provider]  levofloxacin (LEVAQUIN) 500 MG tablet  Take 1 tablet (500 mg total) by mouth daily. 02/15/21   Margarita Grizzle, MD  omeprazole (PRILOSEC) 40 MG capsule Take 1 capsule (40 mg total) by mouth in the morning and at bedtime. 11/28/20   Unk Lightning, PA    Allergies    Patient has no known allergies.  Review of Systems   Review of Systems  Gastrointestinal: Positive for abdominal pain.  All other systems reviewed and are negative.   Physical Exam Updated Vital Signs BP (!) 109/92   Pulse 91   Temp 98.2 F (36.8 C) (Oral)   Resp 18   SpO2 100%   Physical Exam Vitals and nursing note reviewed.  Constitutional:      Appearance: He is well-developed.  HENT:     Head: Normocephalic and atraumatic.  Cardiovascular:     Rate and Rhythm: Normal rate and regular rhythm.     Heart sounds: No murmur heard.   Pulmonary:     Effort: Pulmonary effort is normal. No respiratory distress.     Breath sounds: Normal breath sounds.  Abdominal:     Palpations: Abdomen is soft.     Tenderness: There is no abdominal tenderness. There is no guarding or rebound.  Musculoskeletal:        General: No tenderness.  Skin:    General: Skin is warm and dry.  Neurological:  Mental Status: He is alert and oriented to person, place, and time.  Psychiatric:        Behavior: Behavior normal.     ED Results / Procedures / Treatments   Labs (all labs ordered are listed, but only abnormal results are displayed) Labs Reviewed  COMPREHENSIVE METABOLIC PANEL - Abnormal; Notable for the following components:      Result Value   Potassium 3.4 (*)    Glucose, Bld 118 (*)    All other components within normal limits  LIPASE, BLOOD  CBC WITH DIFFERENTIAL/PLATELET  URINALYSIS, ROUTINE W REFLEX MICROSCOPIC    EKG None  Radiology CT Abdomen Pelvis W Contrast  Result Date: 02/18/2021 CLINICAL DATA:  Abdominal pain EXAM: CT ABDOMEN AND PELVIS WITH CONTRAST TECHNIQUE: Multidetector CT imaging of the abdomen and pelvis was performed  using the standard protocol following bolus administration of intravenous contrast. CONTRAST:  OMNIPAQUE IOHEXOL 300 MG/ML  SOLN COMPARISON:  None. FINDINGS: Lower chest: No acute abnormality. Hepatobiliary: No focal liver abnormality is seen. No gallstones, gallbladder wall thickening, or biliary dilatation. Pancreas: Unremarkable. No pancreatic ductal dilatation or surrounding inflammatory changes. Spleen: Normal in size without focal abnormality. Adrenals/Urinary Tract: Normal appearance of the adrenal glands. No kidney mass or hydronephrosis identified bilaterally. Urinary bladder appears normal. Stomach/Bowel: Stomach appears within normal limits. No bowel wall thickening, inflammation, or distension. The appendix is visualized and appears normal. Distal colonic diverticula noted without signs of diverticulitis. Vascular/Lymphatic: No significant vascular findings are present. No enlarged abdominal or pelvic lymph nodes. Reproductive: Prostate is unremarkable. Other: No free fluid or fluid collections identified within the abdomen or pelvis. Musculoskeletal: No acute or significant osseous findings. IMPRESSION: 1. No acute findings within the abdomen or pelvis. Electronically Signed   By: Signa Kell M.D.   On: 02/18/2021 20:54    Procedures Procedures   Medications Ordered in ED Medications  iohexol (OMNIPAQUE) 300 MG/ML solution 100 mL (100 mLs Intravenous Contrast Given 02/18/21 2035)    ED Course  I have reviewed the triage vital signs and the nursing notes.  Pertinent labs & imaging results that were available during my care of the patient were reviewed by me and considered in my medical decision making (see chart for details).    MDM Rules/Calculators/A&P                         patient here for evaluation of progressive lower abdominal pain. He is non-toxic appearing on evaluation. He was started on Levaquin two days ago for possible prostatitis. CT abdomen pelvis is negative  for acute abnormality. He also complains of persistent fatigue, dizziness and malaise after having a COVID-19 infection in December. He is concerned that all of his symptoms are secondary to the COVID-19 infection. Discussed with patient unclear source of his abdominal pain at this time. Do recommend that he continue the Levaquin for prostatitis. Discussed outpatient follow-up for further evaluation as well as return precautions.  Final Clinical Impression(s) / ED Diagnoses Final diagnoses:  Lower abdominal pain    Rx / DC Orders ED Discharge Orders    None       Tilden Fossa, MD 02/18/21 2155

## 2021-02-18 NOTE — ED Triage Notes (Signed)
Emergency Medicine Provider Triage Evaluation Note  Jason Haynes , a 45 y.o. male  was evaluated in triage.  Pt complains of periumbilical abd pain. Denies diarrhea or vomiting. Denies fevers or urinary complaints.   Review of Systems  Positive: abd pain Negative: Nvd, urinary sxs  Physical Exam  BP (!) 127/101 (BP Location: Left Arm)   Pulse 97   Temp 98.3 F (36.8 C) (Oral)   Resp 17   SpO2 100%  Gen:   Awake, no distress   HEENT:  Atraumatic  Resp:  Normal effort  Cardiac:  Normal rate  Abd:   Nondistended, TTP to the RLQ and LLQ MSK:   Moves extremities without difficulty  Neuro:  Speech clear   Medical Decision Making  Medically screening exam initiated at 4:43 PM.  Appropriate orders placed.  Jason Haynes was informed that the remainder of the evaluation will be completed by another provider, this initial triage assessment does not replace that evaluation, and the importance of remaining in the ED until their evaluation is complete.  Clinical Impression   45 y/o M presenting for eval of bilat lower abd pain  MSE was initiated and I personally evaluated the patient and placed orders (if any) at  4:43 PM on February 18, 2021.  The patient appears stable so that the remainder of the MSE may be completed by another provider.    Karrie Meres, [place] 02/18/21 1643

## 2021-02-18 NOTE — ED Triage Notes (Signed)
Interpretor used. Pt has lower abd pain with frequency. Denies diarrhea.

## 2021-02-18 NOTE — Care Management (Signed)
ED RNCM reviewed record patient has had 5 ED visits in the past 6 months for similar issues. No PCP noted in recordor patient is Spanish speaking. ED CM forwarded patient's information to the [facility] to contact patient to establish care with Primary Care.

## 2021-02-20 ENCOUNTER — Telehealth: Payer: Self-pay

## 2021-02-20 NOTE — Telephone Encounter (Signed)
Reached out to the patient with Interpreter ID# [phone] to speak with the patient. Identified patient by their last name and DOB. Discussed with patient the reason for the call and the reason for the HFU appt with one of our providers. Patient shared he understood and would like to be scheduled in the AM. Case Manager shared appt date/time, location and contact information.

## 2021-02-27 ENCOUNTER — Encounter (HOSPITAL_COMMUNITY): Payer: Self-pay | Admitting: Emergency Medicine

## 2021-02-27 ENCOUNTER — Emergency Department (HOSPITAL_COMMUNITY)
Admission: EM | Admit: 2021-02-27 | Discharge: 2021-02-28 | Disposition: A | Discharge status: Home / Self Care | Payer: Self-pay | Attending: Emergency Medicine | Admitting: Emergency Medicine

## 2021-02-27 ENCOUNTER — Ambulatory Visit (HOSPITAL_COMMUNITY)
Admission: EM | Admit: 2021-02-27 | Discharge: 2021-02-27 | Disposition: A | Discharge status: Home / Self Care | Payer: Self-pay | Attending: Physician Assistant | Admitting: Physician Assistant

## 2021-02-27 ENCOUNTER — Encounter (HOSPITAL_COMMUNITY): Payer: Self-pay

## 2021-02-27 ENCOUNTER — Other Ambulatory Visit: Payer: Self-pay

## 2021-02-27 DIAGNOSIS — N410 Acute prostatitis: Secondary | ICD-10-CM | POA: Insufficient documentation

## 2021-02-27 DIAGNOSIS — K6289 Other specified diseases of anus and rectum: Secondary | ICD-10-CM | POA: Insufficient documentation

## 2021-02-27 DIAGNOSIS — R1030 Lower abdominal pain, unspecified: Secondary | ICD-10-CM | POA: Insufficient documentation

## 2021-02-27 LAB — COMPREHENSIVE METABOLIC PANEL
ALT: 25 U/L (ref 0–44)
AST: 22 U/L (ref 15–41)
Albumin: 4.1 g/dL (ref 3.5–5.0)
Alkaline Phosphatase: 73 U/L (ref 38–126)
Anion gap: 8 (ref 5–15)
BUN: 7 mg/dL (ref 6–20)
CO2: 28 mmol/L (ref 22–32)
Calcium: 9.4 mg/dL (ref 8.9–10.3)
Chloride: 104 mmol/L (ref 98–111)
Creatinine, Ser: 0.83 mg/dL (ref 0.61–1.24)
GFR, Estimated: >60 mL/min (ref >60)
Glucose, Bld: 115 mg/dL — ABNORMAL HIGH (ref 70–99)
Potassium: 3.5 mmol/L (ref 3.5–5.1)
Sodium: 140 mmol/L (ref 135–145)
Total Bilirubin: 0.8 mg/dL (ref 0.3–1.2)
Total Protein: 6.8 g/dL (ref 6.5–8.1)

## 2021-02-27 LAB — CBC WITH DIFFERENTIAL/PLATELET
Abs Immature Granulocytes: 0.01 10*3/uL (ref 0.00–0.07)
Basophils Absolute: 0 10*3/uL (ref 0.0–0.1)
Basophils Relative: 1 %
Eosinophils Absolute: 0 10*3/uL (ref 0.0–0.5)
Eosinophils Relative: 1 %
HCT: 45.9 % (ref 39.0–52.0)
Hemoglobin: 15.9 g/dL (ref 13.0–17.0)
Immature Granulocytes: 0 %
Lymphocytes Relative: 30 %
Lymphs Abs: 1.2 10*3/uL (ref 0.7–4.0)
MCH: 29.9 pg (ref 26.0–34.0)
MCHC: 34.6 g/dL (ref 30.0–36.0)
MCV: 86.3 fL (ref 80.0–100.0)
Monocytes Absolute: 0.3 10*3/uL (ref 0.1–1.0)
Monocytes Relative: 8 %
Neutro Abs: 2.5 10*3/uL (ref 1.7–7.7)
Neutrophils Relative %: 60 %
Platelets: 184 10*3/uL (ref 150–400)
RBC: 5.32 MIL/uL (ref 4.22–5.81)
RDW: 12.3 % (ref 11.5–15.5)
WBC: 4.1 10*3/uL (ref 4.0–10.5)
nRBC: 0 % (ref 0.0–0.2)

## 2021-02-27 LAB — POCT URINALYSIS DIPSTICK, ED / UC
Bilirubin Urine: NEGATIVE
Glucose, UA: NEGATIVE mg/dL
Hgb urine dipstick: NEGATIVE
Ketones, ur: NEGATIVE mg/dL
Leukocytes,Ua: NEGATIVE
Nitrite: NEGATIVE
Protein, ur: NEGATIVE mg/dL
Specific Gravity, Urine: 1.02 (ref 1.005–1.030)
Urobilinogen, UA: 0.2 mg/dL (ref 0.0–1.0)
pH: 6.5 (ref 5.0–8.0)

## 2021-02-27 MED ORDER — SULFAMETHOXAZOLE-TRIMETHOPRIM 800-160 MG PO TABS: 1.0000 | ORAL_TABLET | Freq: Two times a day (BID) | ORAL | 0 refills | Status: AC

## 2021-02-27 NOTE — ED Provider Notes (Signed)
[facility]    CSN: [id number] Arrival date & time: 02/27/21  2595      History   Chief Complaint Chief Complaint  Patient presents with  . Abdominal Pain  . Urinary Frequency  . Urinary Retention  . Rectal Pain  . Dysuria    HPI Jason Haynes is a 45 y.o. male.   Patient is Spanish-speaking and video interpreter Vernona Rieger was utilized during this visit.  Patient reports a several week history of urinary symptoms including frequency, urgency, rectal pain, burning in prostate with urination.  He was initially seen 02/15/2021 at which point he was started on Levaquin which she reports taking as prescribed.  He was reevaluated in the ER on 02/18/2021 with persistent symptoms at which point work-up was negative including abdominal CT which showed no acute findings.  He reports persistent pain since that time despite completing course of antibiotics.  Pain is rated 8 on a 0-10 pain scale, localized to lower abdomen without radiation, described as burning, worse with micturition, no alleviating factors identified.  He has not tried any over-the-counter medications for symptom management.  He denies recent urogenital procedure or seeing a urologist.  Denies episodes of similar symptoms in the past.  He reports associated headache and nausea but denies any fever, hematuria, dizziness, syncope.     Past Medical History:  Diagnosis Date  . BMI 27.0-27.9,adult   . GERD (gastroesophageal reflux disease)     Patient Active Problem List   Diagnosis Date Noted  . BMI 27.0-27.9,adult     History reviewed. No pertinent surgical history.     Home Medications    Prior to Admission medications   Medication Sig Start Date End Date Taking? Authorizing Provider  sulfamethoxazole-trimethoprim (BACTRIM DS) 800-160 MG tablet Take 1 tablet by mouth 2 (two) times daily for 14 days. 02/27/21 03/13/21 Yes Val Schiavo, Noberto Retort, PA-C  dicyclomine (BENTYL) 10 MG capsule Take 1 capsule (10 mg total)  by mouth 4 (four) times daily -  before meals and at bedtime. 12/06/20   Meryl Dare, MD  Chilton Si Tea, Camellia sinensis, 1000 MG TABS Take by mouth. As needed to help with stomach issues. Patient not taking: Reported on 12/06/2020    [provider]  omeprazole (PRILOSEC) 40 MG capsule Take 1 capsule (40 mg total) by mouth in the morning and at bedtime. 11/28/20   Unk Lightning, PA    Family History Family History  Family history unknown: Yes    Social History Social History   Tobacco Use  . Smoking status: Never Smoker  . Smokeless tobacco: Never Used  Substance Use Topics  . Alcohol use: No    Comment: rarely  . Drug use: No     Allergies   Patient has no known allergies.   Review of Systems Review of Systems  Constitutional: Positive for activity change. Negative for appetite change, fatigue and fever.  Respiratory: Negative for cough and shortness of breath.   Cardiovascular: Negative for chest pain.  Gastrointestinal: Positive for abdominal pain. Negative for diarrhea, nausea and vomiting.  Genitourinary: Positive for frequency and urgency. Negative for flank pain, penile pain and penile swelling.  Musculoskeletal: Negative for arthralgias and myalgias.  Neurological: Negative for dizziness, light-headedness and headaches.     Physical Exam Triage Vital Signs ED Triage Vitals  Enc Vitals Group     BP 02/27/21 0824 127/82     Pulse Rate 02/27/21 0823 96     Resp 02/27/21 0823 18  Temp 02/27/21 0823 98.8 F (37.1 C)     Temp Source 02/27/21 0823 Oral     SpO2 02/27/21 0823 100 %     Weight --      Height --      Head Circumference --      Peak Flow --      Pain Score 02/27/21 0821 3     Pain Loc --      Pain Edu? --      Excl. in GC? --    No data found.  Updated Vital Signs BP 127/82 (BP Location: Right Arm)   Pulse 96   Temp 98.8 F (37.1 C) (Oral)   Resp 18   SpO2 100%   Visual Acuity Right Eye Distance:   Left Eye  Distance:   Bilateral Distance:    Right Eye Near:   Left Eye Near:    Bilateral Near:     Physical Exam Vitals reviewed.  Constitutional:      General: He is awake.     Appearance: Normal appearance. He is normal weight. He is not ill-appearing.     Comments: Very pleasant male appears stated age in no acute distress  HENT:     Head: Normocephalic and atraumatic.  Cardiovascular:     Rate and Rhythm: Normal rate and regular rhythm.     Heart sounds: No murmur heard.   Pulmonary:     Effort: Pulmonary effort is normal.     Breath sounds: Normal breath sounds. No stridor. No wheezing, rhonchi or rales.     Comments: Clear to auscultation bilaterally Abdominal:     General: Bowel sounds are normal.     Palpations: Abdomen is soft.     Tenderness: There is abdominal tenderness in the suprapubic area. There is no right CVA tenderness, left CVA tenderness, guarding or rebound.     Comments: Mild tender palpation in suprapubic region.  No evidence of acute abdomen on physical exam.  Genitourinary:    Comments: Exam deferred Neurological:     Mental Status: He is alert.  Psychiatric:        Behavior: Behavior is cooperative.      UC Treatments / Results  Labs (all labs ordered are listed, but only abnormal results are displayed) Labs Reviewed  CBC WITH DIFFERENTIAL/PLATELET  COMPREHENSIVE METABOLIC PANEL  POCT URINALYSIS DIPSTICK, ED / UC    EKG   Radiology No results found.  Procedures Procedures (including critical care time)  Medications Ordered in UC Medications - No data to display  Initial Impression / Assessment and Plan / UC Course  I have reviewed the triage vital signs and the nursing notes.  Pertinent labs & imaging results that were available during my care of the patient were reviewed by me and considered in my medical decision making (see chart for details).      Vital signs and physical exam reassuring today; no indication for emergent  evaluation or imaging.  UA was normal in office today.  Concern for prostatitis given clinical presentation so we will transition patient to Bactrim DS for 2 weeks.  Discussed that if he has any mouth ulcers or rashes he needs to stop the medication and be evaluated.  Discussed the importance of following up with urology and he was given their contact information with instruction to call to schedule an appointment immediately following visit today.  CBC and CMP updated today and discussed potential need to go to the emergency room if CBC  shows significant leukocytosis.  Strict return precautions given to which patient expressed understanding.  Final Clinical Impressions(s) / UC Diagnoses   Final diagnoses:  Acute prostatitis  Lower abdominal pain     Discharge Instructions     Take antibiotic twice daily with food.  Make sure you are drinking plenty of fluid.  Please call urology as soon as you leave to schedule an appointment ASAP.  If you have any worsening symptoms including nausea/vomiting, abdominal pain, fever you need to be seen in the emergency room as we discussed.    ED Prescriptions    Medication Sig Dispense Auth. Provider   sulfamethoxazole-trimethoprim (BACTRIM DS) 800-160 MG tablet Take 1 tablet by mouth 2 (two) times daily for 14 days. 28 tablet Marilyne Haseley, Noberto Retort, PA-C     PDMP not reviewed this encounter.   Jeani Hawking, PA-C 02/27/21 [phone]

## 2021-02-27 NOTE — ED Triage Notes (Signed)
Emergency Medicine Provider Triage Evaluation Note  Jason Haynes , a 45 y.o. male  was evaluated in triage.  Pt complains of rectal pain, dysuria, frequency. Ongoing problem previously diagnosed with prostatitis, treated with Levaquin. Had ER visit with CT done. Returned to UC today, had normal CBC, CMP, UA, given Bactrim and referred to urology. Took 1 dose of Bactrim without improvement and came to the ER. Interpreter used for visit today.   Review of Systems  Positive: Dysuria, frequency, rectal pain Negative: fever  Physical Exam  BP 116/81   Pulse 92   Temp 98.7 F (37.1 C) (Oral)   Resp 16   Ht 5\' 4"  (1.626 m)   Wt 76.7 kg   SpO2 100%   BMI 29.02 kg/m  Gen:   Awake, no distress   HEENT:  Atraumatic  Resp:  Normal effort  Cardiac:  Normal rate  Abd:   Nondistended, nontender  MSK:   Moves extremities without difficulty  Neuro:  Speech clear   Medical Decision Making  Medically screening exam initiated at 8:55 PM.  Appropriate orders placed.  Waldon Sheerin Brien Few was informed that the remainder of the evaluation will be completed by another provider, this initial triage assessment does not replace that evaluation, and the importance of remaining in the ED until their evaluation is complete.  Clinical Impression  Labs and note reviewed, discussed results with pt. No new labs ordered. Requests PSA check, advised this is not an ER lab.    Molli Hazard, PA-C 02/27/21 2057

## 2021-02-27 NOTE — ED Triage Notes (Signed)
Pt continues to have penis pain, was given antibiotics by urgent care.  Pt reports he can't stop the burning when he urinates.  Use of spanish interpretor.

## 2021-02-27 NOTE — Discharge Instructions (Signed)
Take antibiotic twice daily with food.  Make sure you are drinking plenty of fluid.  Please call urology as soon as you leave to schedule an appointment ASAP.  If you have any worsening symptoms including nausea/vomiting, abdominal pain, fever you need to be seen in the emergency room as we discussed.

## 2021-02-27 NOTE — ED Triage Notes (Signed)
Pt c/o lower abdominal pain, frequency and urgency x 4 days. Pt states he urinates in small amounts. Pt states he has a burning sensation when urinating. He states he has had rectal pain.

## 2021-02-28 LAB — URINALYSIS, ROUTINE W REFLEX MICROSCOPIC
Bilirubin Urine: NEGATIVE
Glucose, UA: NEGATIVE mg/dL
Hgb urine dipstick: NEGATIVE
Ketones, ur: NEGATIVE mg/dL
Leukocytes,Ua: NEGATIVE
Nitrite: NEGATIVE
Protein, ur: NEGATIVE mg/dL
Specific Gravity, Urine: 1.01 (ref 1.005–1.030)
pH: 7 (ref 5.0–8.0)

## 2021-02-28 MED ORDER — CEFTRIAXONE SODIUM 1 G IJ SOLR
1.0000 g | Freq: Once | INTRAMUSCULAR | Status: AC
  Administered 2021-02-28: 1 g via INTRAMUSCULAR
  Filled 2021-02-28: qty 10

## 2021-02-28 MED ORDER — DOXYCYCLINE HYCLATE 100 MG PO CAPS: 100.0000 mg | ORAL_CAPSULE | Freq: Two times a day (BID) | ORAL | 0 refills | Status: AC

## 2021-02-28 MED ORDER — HYDROCODONE-ACETAMINOPHEN 5-325 MG PO TABS: 1.0000 | ORAL_TABLET | ORAL | 0 refills | Status: AC | PRN

## 2021-02-28 MED ORDER — KETOROLAC TROMETHAMINE 60 MG/2ML IM SOLN
60.0000 mg | Freq: Once | INTRAMUSCULAR | Status: AC
  Administered 2021-02-28: 60 mg via INTRAMUSCULAR
  Filled 2021-02-28: qty 2

## 2021-02-28 MED ORDER — DOXYCYCLINE HYCLATE 100 MG PO TABS
100.0000 mg | ORAL_TABLET | Freq: Once | ORAL | Status: AC
  Administered 2021-02-28: 100 mg via ORAL
  Filled 2021-02-28: qty 1

## 2021-02-28 MED ORDER — STERILE WATER FOR INJECTION IJ SOLN
INTRAMUSCULAR | Status: AC
  Administered 2021-02-28: 10 mL
  Filled 2021-02-28: qty 10

## 2021-02-28 MED ORDER — IBUPROFEN 800 MG PO TABS: 800.0000 mg | ORAL_TABLET | Freq: Four times a day (QID) | ORAL | 0 refills | Status: DC | PRN

## 2021-02-28 NOTE — ED Provider Notes (Signed)
[facility] EMERGENCY DEPARTMENT Provider Note   CSN: [id number] Arrival date & time: 02/27/21  2007     History Chief Complaint  Patient presents with  . Penis Pain    Rad Gramling Molli Hazard is a 45 y.o. male.  Patient returns to the ER with persistent bladder area discomfort and rectal pain.  Patient has been to urgent care twice in the ER once for this.  He has been diagnosed with prostatitis.  Medication was changed from Levaquin to Bactrim today at urgent care.  Patient has taken 1 dose but returns to the ER because he is still having a lot of discomfort.  No fever, nausea, vomiting, diarrhea or rectal bleeding.        Past Medical History:  Diagnosis Date  . BMI 27.0-27.9,adult   . GERD (gastroesophageal reflux disease)     Patient Active Problem List   Diagnosis Date Noted  . BMI 27.0-27.9,adult     History reviewed. No pertinent surgical history.     Family History  Family history unknown: Yes    Social History   Tobacco Use  . Smoking status: Never Smoker  . Smokeless tobacco: Never Used  Substance Use Topics  . Alcohol use: No    Comment: rarely  . Drug use: No    Home Medications Prior to Admission medications   Medication Sig Start Date End Date Taking? Authorizing Provider  doxycycline (VIBRAMYCIN) 100 MG capsule Take 1 capsule (100 mg total) by mouth 2 (two) times daily. 02/28/21  Yes Raymon Schlarb, Canary Brim, MD  HYDROcodone-acetaminophen (NORCO/VICODIN) 5-325 MG tablet Take 1 tablet by mouth every 4 (four) hours as needed for moderate pain. 02/28/21  Yes Karolyna Bianchini, Canary Brim, MD  ibuprofen (ADVIL) 800 MG tablet Take 1 tablet (800 mg total) by mouth every 6 (six) hours as needed for moderate pain. 02/28/21  Yes Sindia Kowalczyk, Canary Brim, MD  dicyclomine (BENTYL) 10 MG capsule Take 1 capsule (10 mg total) by mouth 4 (four) times daily -  before meals and at bedtime. 12/06/20   Meryl Dare, MD  Chilton Si Tea, Camellia sinensis, 1000 MG  TABS Take by mouth. As needed to help with stomach issues. Patient not taking: Reported on 12/06/2020    [provider]  omeprazole (PRILOSEC) 40 MG capsule Take 1 capsule (40 mg total) by mouth in the morning and at bedtime. 11/28/20   Unk Lightning, PA  sulfamethoxazole-trimethoprim (BACTRIM DS) 800-160 MG tablet Take 1 tablet by mouth 2 (two) times daily for 14 days. 02/27/21 03/13/21  Raspet, Noberto Retort, PA-C    Allergies    Patient has no known allergies.  Review of Systems   Review of Systems  Gastrointestinal: Positive for rectal pain. Negative for anal bleeding.  All other systems reviewed and are negative.   Physical Exam Updated Vital Signs BP 117/85 (BP Location: Left Arm)   Pulse 80   Temp 98.1 F (36.7 C) (Temporal)   Resp 14   Ht 5\' 4"  (1.626 m)   Wt 76.7 kg   SpO2 100%   BMI 29.02 kg/m   Physical Exam Vitals and nursing note reviewed. Exam conducted with a chaperone present.  Constitutional:      General: He is not in acute distress.    Appearance: Normal appearance. He is well-developed.  HENT:     Head: Normocephalic and atraumatic.     Right Ear: Hearing normal.     Left Ear: Hearing normal.     Nose: Nose  normal.  Eyes:     Conjunctiva/sclera: Conjunctivae normal.     Pupils: Pupils are equal, round, and reactive to light.  Cardiovascular:     Rate and Rhythm: Regular rhythm.     Heart sounds: S1 normal and S2 normal. No murmur heard. No friction rub. No gallop.   Pulmonary:     Effort: Pulmonary effort is normal. No respiratory distress.     Breath sounds: Normal breath sounds.  Chest:     Chest wall: No tenderness.  Abdominal:     General: Bowel sounds are normal.     Palpations: Abdomen is soft.     Tenderness: There is no abdominal tenderness. There is no guarding or rebound. Negative signs include Murphy's sign and McBurney's sign.     Hernia: No hernia is present.  Genitourinary:    Prostate: Enlarged and tender.   Musculoskeletal:        General: Normal range of motion.     Cervical back: Normal range of motion and neck supple.  Skin:    General: Skin is warm and dry.     Findings: No rash.  Neurological:     Mental Status: He is alert and oriented to person, place, and time.     GCS: GCS eye subscore is 4. GCS verbal subscore is 5. GCS motor subscore is 6.     Cranial Nerves: No cranial nerve deficit.     Sensory: No sensory deficit.     Coordination: Coordination normal.  Psychiatric:        Speech: Speech normal.        Behavior: Behavior normal.        Thought Content: Thought content normal.     ED Results / Procedures / Treatments   Labs (all labs ordered are listed, but only abnormal results are displayed) Labs Reviewed  URINALYSIS, ROUTINE W REFLEX MICROSCOPIC - Abnormal; Notable for the following components:      Result Value   APPearance HAZY (*)    All other components within normal limits  URINE CULTURE  GC/CHLAMYDIA PROBE AMP ([place]) NOT AT [facility]    EKG None  Radiology No results found.  Procedures Procedures   Medications Ordered in ED Medications  cefTRIAXone (ROCEPHIN) injection 1 g (1 g Intramuscular Given 02/28/21 0344)  doxycycline (VIBRA-TABS) tablet 100 mg (100 mg Oral Given 02/28/21 0344)  ketorolac (TORADOL) injection 60 mg (60 mg Intramuscular Given 02/28/21 0344)  sterile water (preservative free) injection (10 mLs  Given 02/28/21 0344)    ED Course  I have reviewed the triage vital signs and the nursing notes.  Pertinent labs & imaging results that were available during my care of the patient were reviewed by me and considered in my medical decision making (see chart for details).    MDM Rules/Calculators/A&P                          Prostate is somewhat enlarged, tender and boggy on exam.  Patient's work-up has already been performed at previous visits including CT scan several days ago that did not show any acute abnormality.  White count  today was 4.  Does not require further work-up but will broaden out treatment.  Agree with Bactrim, will add Rocephin and doxycycline.  Repeat urinalysis and culture after prostate exam.  Add GC and chlamydia to urine.  Final Clinical Impression(s) / ED Diagnoses Final diagnoses:  Acute prostatitis    Rx / DC  Orders ED Discharge Orders         Ordered    doxycycline (VIBRAMYCIN) 100 MG capsule  2 times daily        02/28/21 0412    ibuprofen (ADVIL) 800 MG tablet  Every 6 hours PRN        02/28/21 0412    HYDROcodone-acetaminophen (NORCO/VICODIN) 5-325 MG tablet  Every 4 hours PRN        02/28/21 0412           Gilda Crease, MD 02/28/21 [phone]

## 2021-03-01 LAB — URINE CULTURE: Culture: NO GROWTH

## 2021-03-04 NOTE — Progress Notes (Signed)
Subjective:    Jason Haynes - 45 y.o. male MRN [id number]  Date of birth: 20-Oct-1976  HPI  Jason Haynes is to establish care and hospital follow-up. Patient has a PMH significant for prostatitis.   Current issues and/or concerns: Visit 02/27/2021 at [facility] Emergency Department per MD note: Prostate is somewhat enlarged, tender and boggy on exam.  Patient's work-up has already been performed at previous visits including CT scan several days ago that did not show any acute abnormality.  White count today was 4.  Does not require further work-up but will broaden out treatment.  Agree with Bactrim, will add Rocephin and doxycycline.  Repeat urinalysis and culture after prostate exam.  Add GC and chlamydia to urine.  03/05/2021: PROSTATITIS FOLLOW-UP: Dysuria: No  Urinary frequency: Yes Urinary urgency: Yes   Ejaculatory pain: No  Low back pain: Yes   Lower abdominal pain: Yes Blood in urine: No Scrotal pain: No Pain in penis: No   Diminished urinary stream, hesitancy, straining: Yes Comments: Feels there is a burning sensation. Denies rectal pain. Patient reports he is unable to void today for repeat urinalysis. Reports he received a call from [facility] about 3 weeks ago for an appointment scheduled 03/07/2021. He is unsure if the appointment is confirmed. Reports when he was called the representative was speaking [place] and he was unable to understand. Plans to call them back today after this visit.     ROS per HPI    Health Maintenance:  Health Maintenance Due  Topic Date Due  . Hepatitis C Screening  Never done  . HIV Screening  Never done  . TETANUS/TDAP  Never done     Past Medical History: Patient Active Problem List   Diagnosis Date Noted  . BMI 27.0-27.9,adult     Social History   reports that he has never smoked. He has never used smokeless tobacco. He reports that he does not drink alcohol and does not use drugs.   Family  History  Family history is unknown by patient.   Medications: reviewed and updated   Objective:   Physical Exam Physical Exam HENT:     Head: Normocephalic and atraumatic.  Eyes:     Extraocular Movements: Extraocular movements intact.     Conjunctiva/sclera: Conjunctivae normal.     Pupils: Pupils are equal, round, and reactive to light.  Cardiovascular:     Pulses: Normal pulses.     Heart sounds: Normal heart sounds.  Pulmonary:     Effort: Pulmonary effort is normal.     Breath sounds: Normal breath sounds.  Abdominal:     General: Bowel sounds are normal.     Palpations: Abdomen is soft.     Tenderness: There is abdominal tenderness.     Comments: Hypogastric region and left lower quadrant tenderness.  Musculoskeletal:     Cervical back: Normal range of motion and neck supple.  Neurological:     Mental Status: He is alert.    Assessment & Plan:  1. Encounter to establish care: - Patient presents today to establish care.  - Return for annual physical examination, labs, and health maintenance. Arrive fasting meaning having no for at least 8 hours prior to appointment. You may have only water or black coffee. Please take scheduled medications as normal.  2. Hospital discharge follow-up: 3. Acute prostatitis: - Since hospital discharge continuing abdominal pain, urinary symptoms, and burning sensation of prostate. - Urine culture 02/28/2021 resulted no growth and  urinalysis negative. - Referral to Urology for further evaluation and management. - Ambulatory referral to Urology  4. Language barrier: - [place] interpreter, Lissa Hoard, present during today's visit.     Patient was given clear instructions to go to Emergency Department or return to medical center if symptoms don't improve, worsen, or new problems develop.The patient verbalized understanding.  I discussed the assessment and treatment plan with the patient. The patient was provided an opportunity to ask  questions and all were answered. The patient agreed with the plan and demonstrated an understanding of the instructions.   The patient was advised to call back or seek an in-person evaluation if the symptoms worsen or if the condition fails to improve as anticipated.    Ricky Stabs, NP 03/05/2021, 12:45 PM Primary Care at [facility]

## 2021-03-05 ENCOUNTER — Other Ambulatory Visit: Payer: Self-pay

## 2021-03-05 ENCOUNTER — Ambulatory Visit (INDEPENDENT_AMBULATORY_CARE_PROVIDER_SITE_OTHER): Payer: Self-pay | Admitting: Family

## 2021-03-05 ENCOUNTER — Encounter: Payer: Self-pay | Admitting: Family

## 2021-03-05 DIAGNOSIS — N410 Acute prostatitis: Secondary | ICD-10-CM

## 2021-03-05 DIAGNOSIS — Z789 Other specified health status: Secondary | ICD-10-CM

## 2021-03-05 DIAGNOSIS — Z09 Encounter for follow-up examination after completed treatment for conditions other than malignant neoplasm: Secondary | ICD-10-CM

## 2021-03-05 DIAGNOSIS — Z7689 Persons encountering health services in other specified circumstances: Secondary | ICD-10-CM

## 2021-03-05 NOTE — Progress Notes (Signed)
HFU  Pt stating his prostate is burning

## 2021-03-05 NOTE — Patient Instructions (Addendum)
Derivacin a Personal assistant.  Solicite la Solicitud de tarjeta naranja de descuento financiero de [employer]. Prostatitis Prostatitis  La prostatitis es la inflamacin de la glndula prosttica, tambin denominada prstata. Esta glndula mide aproximadamente 3,8cm (1,5 pulgadas) de ancho y 2,5cm (1 pulgada) de alto, y [place] a formar un lquido denominado semen. La prstata est ubicada debajo de la vejiga del hombre, delante de las nalgas (el recto). Hay distintos tipos de prostatitis. Cules son las causas? Un tipo de prostatitis es causada por una infeccin por microbios (bacterias). Otro tipo no es causado por microbios. Las [employer] ser las siguientes:  Cuestiones relacionadas con el sistema nervioso. Este sistema incluye elcerebro, la mdula espinal y los nervios.  Una respuesta autoinmunitaria. Esto ocurre cuando el sistema del cuerpo encargado de combatir las enfermedades ataca el tejido sano del cuerpo por error.  Factores psicolgicos. Tienen que ver con el funcionamiento de la mente. Normalmente, se desconocen las causas de otros tipos de prostatitis. Cules son los signos o sntomas? Los sntomas de esta afeccin pueden variar segn el tipo de prostatitis que tenga. Si su afeccin es a causa de una bacteria:  Puede sentir dolor o ardor al hacer pis (orinar).  Puede hacer pis con frecuencia y sentir necesidad de repente.  Puede tener problemas para comenzar a Banker.  Puede tener dificultad para vaciar la vejiga cuando hace pis.  Puede tener fiebre o escalofros.  Puede sentir [employer], las articulaciones, la parte baja de la espalda o la parte inferior del abdomen. Si tiene otros tipos de prostatitis:  Puede hacer pis con frecuencia o sentir necesidad de repente.  Puede tener dificultad para comenzar a Banker.  Puede tener un flujo dbil al hacer pis.  Puede tener prdidas de pis despus de usar el bao.  Puede tener [employer], tales  como: ? Lquido anormal que sale del pene. ? Dolor en el pene o los testculos. ? Dolor entre el ano y los testculos. ? Dolor al eliminar el lquido del pene durante el sexo. Cmo se trata? El tratamiento de esta afeccin depende del tipo de prostatitis. El tratamiento puede incluir:  Medicamentos. Estos pueden tratar [employer] o la inflamacin, o pueden ayudar a [employer].  Ejercicios para ayudarlo a que se mueva mejor o se fortalezca (fisioterapia).  Terapia con calor.  Tcnicas para ayudarlo a Haematologist de las [employer] funciona el cuerpo.  Ejercicios para ayudarlo a relajarse.  Antibiticos si la afeccin es causada por microbios.  Baos de agua tibia (baos de asiento) para [employer]. Siga estas instrucciones en su casa: Medicamentos  Use los medicamentos de venta libre y los recetados solamente como se lo haya indicado el mdico.  Si le recetaron un antibitico, tmelo como se lo haya indicado el mdico. No deje de usar el antibitico aunque comience a Actor. Control del dolor y de la hinchazn  Tome baos de asiento como se lo haya indicado el mdico. Para realizar un bao de asiento, sintese en agua tibia que le cubra la cadera y las nalgas.  Si se lo indican, aplique calor sobre la zona dolorida. Hgalo con la frecuencia que le haya indicado el mdico. Use la fuente de calor que el mdico le recomiende, como una compresa de calor hmedo o una almohadilla trmica. ? Coloque una [employer] piel y la fuente de Airline pilot. ? Aplique calor durante 20 a . ? Retire la fuente de calor  si la piel se le pone de color rojo brillante. Esto es muy importante si no puede Financial risk analyst, calor o fro. Puede correr un mayor riesgo de sufrir quemaduras.   Instrucciones generales  Haga ejercicios como se lo haya indicado el mdico si el mdico lo prescribe.  Concurra a todas las visitas de [address] se lo haya indicado el  mdico. Esto es importante. Dnde buscar ms informacin  [employer] of Diabetes and Digestive and Kidney Diseases [employer] de la Diabetes y las Enfermedades Digestivas y Renales): LowApproval.se Comunquese con un mdico si:  Sus sntomas empeoran.  Tiene fiebre. Solicite ayuda de inmediato si:  Tiene escalofros.  Siente que va a desvanecerse.  Se siente como si fuera a desmayarse.  No puede hacer pis.  Tiene sangre o grumos de sangre (cogulos de sangre) en el pis. Resumen  La prostatitis es la inflamacin de la prstata.  Hay distintos tipos de prostatitis. El tratamiento depende del tipo que tenga.  Use los medicamentos de venta libre y los recetados solamente como se lo haya indicado el mdico.  Busque ayuda de inmediato si siente escalofros, se siente mareado o siente que va a desmayarse. Tambin busque ayuda de inmediato si no puede hacer pis o tiene sangre o grumos de [employer]. Esta informacin no tiene Theme park manager el consejo del mdico. Asegrese de hacerle al mdico cualquier pregunta que tenga. Document Revised: 01/27/2020 Document Reviewed: 01/27/2020 Elsevier Patient Education  2021 [employer].

## 2021-05-27 NOTE — Progress Notes (Signed)
Patient did not show for appointment.   

## 2021-05-28 ENCOUNTER — Encounter: Payer: Self-pay | Admitting: Family

## 2021-05-28 DIAGNOSIS — Z130 Encounter for screening for diseases of the blood and blood-forming organs and certain disorders involving the immune mechanism: Secondary | ICD-10-CM

## 2021-05-28 DIAGNOSIS — Z13228 Encounter for screening for other metabolic disorders: Secondary | ICD-10-CM

## 2021-05-28 DIAGNOSIS — Z1211 Encounter for screening for malignant neoplasm of colon: Secondary | ICD-10-CM

## 2021-05-28 DIAGNOSIS — Z0000 Encounter for general adult medical examination without abnormal findings: Secondary | ICD-10-CM

## 2021-05-28 DIAGNOSIS — Z1329 Encounter for screening for other suspected endocrine disorder: Secondary | ICD-10-CM

## 2021-05-28 DIAGNOSIS — Z131 Encounter for screening for diabetes mellitus: Secondary | ICD-10-CM

## 2021-05-28 DIAGNOSIS — Z789 Other specified health status: Secondary | ICD-10-CM

## 2021-05-28 DIAGNOSIS — Z1159 Encounter for screening for other viral diseases: Secondary | ICD-10-CM

## 2021-05-28 DIAGNOSIS — Z114 Encounter for screening for human immunodeficiency virus [HIV]: Secondary | ICD-10-CM

## 2021-05-28 DIAGNOSIS — Z13220 Encounter for screening for lipoid disorders: Secondary | ICD-10-CM

## 2021-07-22 ENCOUNTER — Other Ambulatory Visit: Payer: Self-pay

## 2021-07-22 ENCOUNTER — Encounter (HOSPITAL_COMMUNITY): Payer: Self-pay

## 2021-07-22 ENCOUNTER — Ambulatory Visit (HOSPITAL_COMMUNITY)
Admission: EM | Admit: 2021-07-22 | Discharge: 2021-07-22 | Disposition: A | Discharge status: Home / Self Care | Payer: Self-pay | Attending: Sports Medicine | Admitting: Sports Medicine

## 2021-07-22 ENCOUNTER — Ambulatory Visit (HOSPITAL_COMMUNITY)
Admission: EM | Admit: 2021-07-22 | Discharge: 2021-07-22 | Disposition: A | Discharge status: Home / Self Care | Payer: Self-pay

## 2021-07-22 DIAGNOSIS — J029 Acute pharyngitis, unspecified: Secondary | ICD-10-CM

## 2021-07-22 MED ORDER — IBUPROFEN 800 MG PO TABS: 800.0000 mg | ORAL_TABLET | Freq: Three times a day (TID) | ORAL | 0 refills | Status: AC | PRN

## 2021-07-22 NOTE — ED Provider Notes (Signed)
[facility]    CSN: [id number] Arrival date & time: 07/22/21  1200      History   Chief Complaint Chief Complaint  Patient presents with   Sore Throat   Nasal Congestion     HPI Jason Haynes is a 45 y.o. male who presents with sore throat, nasal congestion.   HPI  Sore throat and tender submandibular left lymph nodes x 3 days His wife is having similar symptoms since last week No known COVID-19 exposures  No fever/chills Sometimes having some cough, non-productive He has been having a runny nose and stuff nose - clear drainage Tender lymph node in neck when he swallows, but no difficulty swallowing He also reports mild headache in the frontal region x 1 day. He has not been eating and drinking per normal because of feeling ill and some throat pain. Denies any ear pain. Denies any chest pain or shortness of breath  Treatment: Taking Theraflu and other OTC medicine     Past Medical History:  Diagnosis Date   BMI 27.0-27.9,adult    GERD (gastroesophageal reflux disease)     Patient Active Problem List   Diagnosis Date Noted   BMI 27.0-27.9,adult     History reviewed. No pertinent surgical history.     Home Medications    Prior to Admission medications   Medication Sig Start Date End Date Taking? Authorizing Provider  dicyclomine (BENTYL) 10 MG capsule Take 1 capsule (10 mg total) by mouth 4 (four) times daily -  before meals and at bedtime. 12/06/20   Meryl Dare, MD  doxycycline (VIBRAMYCIN) 100 MG capsule Take 1 capsule (100 mg total) by mouth 2 (two) times daily. 02/28/21   Gilda Crease, MD  Green Tea, Camellia sinensis, 1000 MG TABS Take by mouth. As needed to help with stomach issues. Patient not taking: No sig reported    [provider]  HYDROcodone-acetaminophen (NORCO/VICODIN) 5-325 MG tablet Take 1 tablet by mouth every 4 (four) hours as needed for moderate pain. 02/28/21   Gilda Crease, MD  ibuprofen  (ADVIL) 800 MG tablet Take 1 tablet (800 mg total) by mouth every 8 (eight) hours as needed for moderate pain. 07/22/21   Madelyn Brunner, DO  omeprazole (PRILOSEC) 40 MG capsule Take 1 capsule (40 mg total) by mouth in the morning and at bedtime. 11/28/20   Unk Lightning, PA    Family History Family History  Family history unknown: Yes    Social History Social History   Tobacco Use   Smoking status: Never   Smokeless tobacco: Never  Substance Use Topics   Alcohol use: No    Comment: rarely   Drug use: No     Allergies   Patient has no known allergies.   Review of Systems Review of Systems  Constitutional:  Positive for appetite change. Negative for chills, fever and unexpected weight change.  HENT:  Positive for congestion, rhinorrhea (clear) and sore throat. Negative for ear pain and trouble swallowing.   Respiratory:  Positive for cough (mild). Negative for shortness of breath and wheezing.   Cardiovascular:  Negative for chest pain and palpitations.  Gastrointestinal:  Negative for abdominal pain.  Skin:  Negative for rash.  Neurological:  Positive for headaches. Negative for dizziness and weakness.    Physical Exam Triage Vital Signs ED Triage Vitals  Enc Vitals Group     BP      Pulse      Resp  Temp      Temp src      SpO2      Weight      Height      Head Circumference      Peak Flow      Pain Score      Pain Loc      Pain Edu?      Excl. in GC?    No data found.  Updated Vital Signs BP 118/78 (BP Location: Left Arm)   Pulse 89   Temp 98.8 F (37.1 C) (Oral)   Resp 16   SpO2 97%   Visual Acuity Right Eye Distance:   Left Eye Distance:   Bilateral Distance:    Right Eye Near:   Left Eye Near:    Bilateral Near:     Physical Exam Constitutional:      Appearance: He is well-developed. He is not ill-appearing or toxic-appearing.  HENT:     Head: Normocephalic and atraumatic.     Right Ear: Tympanic membrane and ear canal  normal.     Left Ear: Tympanic membrane and ear canal normal.     Nose: Congestion (clear drainage) present.     Mouth/Throat:     Mouth: Mucous membranes are moist.     Pharynx: Posterior oropharyngeal erythema (erythema of posterior pharynx b/l; no exudates or lesions noted) present.     Tonsils: No tonsillar exudate.  Eyes:     Conjunctiva/sclera: Conjunctivae normal.     Pupils: Pupils are equal, round, and reactive to light.  Neck:     Thyroid: No thyromegaly.  Cardiovascular:     Rate and Rhythm: Normal rate.     Heart sounds: No murmur heard. Pulmonary:     Effort: Pulmonary effort is normal.     Breath sounds: Normal breath sounds. No wheezing or rales.  Abdominal:     Palpations: Abdomen is soft.  Musculoskeletal:     Cervical back: Normal range of motion.  Lymphadenopathy:     Cervical: Cervical adenopathy present.  Skin:    Capillary Refill: Capillary refill takes less than 2 seconds.  Neurological:     Mental Status: He is alert.  Psychiatric:        Mood and Affect: Mood normal.     UC Treatments / Results  Labs (all labs ordered are listed, but only abnormal results are displayed) Labs Reviewed - No data to display  EKG   Radiology No results found.  Procedures Procedures (including critical care time)  Medications Ordered in UC Medications - No data to display  Initial Impression / Assessment and Plan / UC Course  I have reviewed the triage vital signs and the nursing notes.  Pertinent labs & imaging results that were available during my care of the patient were reviewed by me and considered in my medical decision making (see chart for details).     Viral pharyngitis - sore throat and nasal congestion, rhinorrhea, mild non-productive cough x 3 days. CENTOR criteria: 1. His wife had similar symptoms last week, likely viral in nature. No fever/chills.   - discussed supportive care, including importance of adequate hydration, rest, and  well-balanced meals - may use Ibuprofen every 8 hours as needed for pharyngitis, pain - gargle warm salt water; may use honey, cough drops for throat irritation - educated patient on typical viral symptom course of 7-10 days, return precautions provided regarding second-sickening, worsening of symptoms, or signs of dehydration  Final Clinical Impressions(s) / UC  Diagnoses   Final diagnoses:  Viral pharyngitis     Discharge Instructions      1) Increase water intake and hydration 2) Take Ibuprofen every 8 hours as needed for pain and irritation 3) Things to try to improve throat pain: gargle warm salt water, cough drops, or honey  You may continue to use Theraflu if needed. Make sure you are getting good rest and eating well. If you do not feel better after 10 days or if you start developing a fever (temp > 100.4 F), you may return to urgent care or see PCP     ED Prescriptions     Medication Sig Dispense Auth. Provider   ibuprofen (ADVIL) 800 MG tablet Take 1 tablet (800 mg total) by mouth every 8 (eight) hours as needed for moderate pain. 20 tablet Madelyn Brunner, DO      PDMP not reviewed this encounter.   Madelyn Brunner, DO 07/22/21 1503

## 2021-07-22 NOTE — Discharge Instructions (Addendum)
1) Increase water intake and hydration 2) Take Ibuprofen every 8 hours as needed for pain and irritation 3) Things to try to improve throat pain: gargle warm salt water, cough drops, or honey  You may continue to use Theraflu if needed. Make sure you are getting good rest and eating well. If you do not feel better after 10 days or if you start developing a fever (temp > 100.4 F), you may return to urgent care or see PCP

## 2021-07-22 NOTE — ED Triage Notes (Signed)
Pt reports nasal congestion and sore throat x 2 days. States the throat feels like burning when eating or drinking.

## 2022-09-03 IMAGING — CT CT ABD-PELV W/ CM
2 of 5 series · 16 of 46 positions shown, 18 images · IV contrast (omnipaque)
Comparison: None.

CLINICAL DATA: Abdominal pain

EXAM:
CT ABDOMEN AND PELVIS WITH CONTRAST
TECHNIQUE: Multidetector CT imaging of the abdomen and pelvis was performed
using the standard protocol following bolus administration of
intravenous contrast.
CONTRAST:  100mL OMNIPAQUE IOHEXOL 300 MG/ML  SOLN

[Series 3: abd/ pelvis 5.0 i30f 2 · axial · 0.83mm/px · z∈[+893,+1318]mm · 13 of 97 slices shown, 15 images]
[im 6/97  soft-tissue]
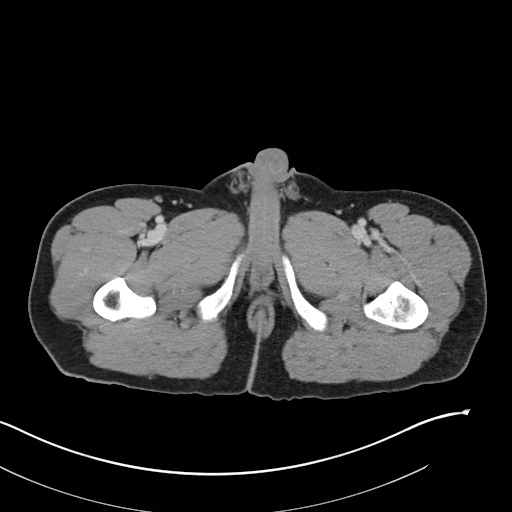
[im 6/97  bone]
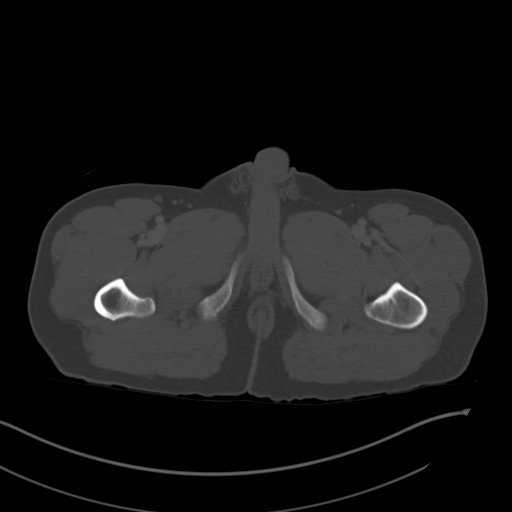
[im 11/97  soft-tissue]
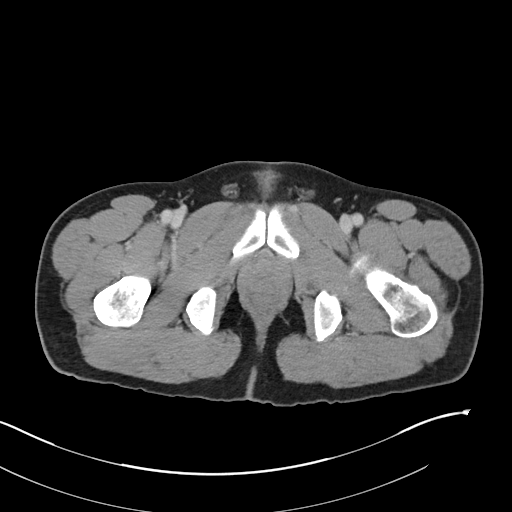
[im 22/97  soft-tissue]
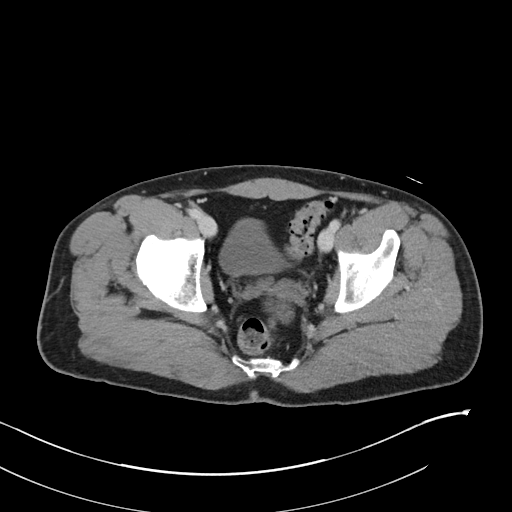
[im 27/97  soft-tissue]
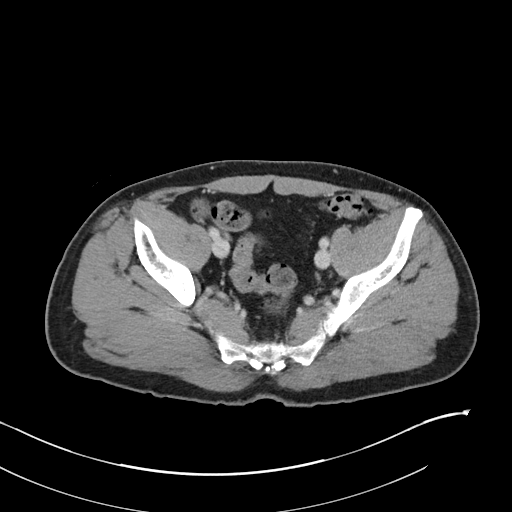
[im 33/97  soft-tissue]
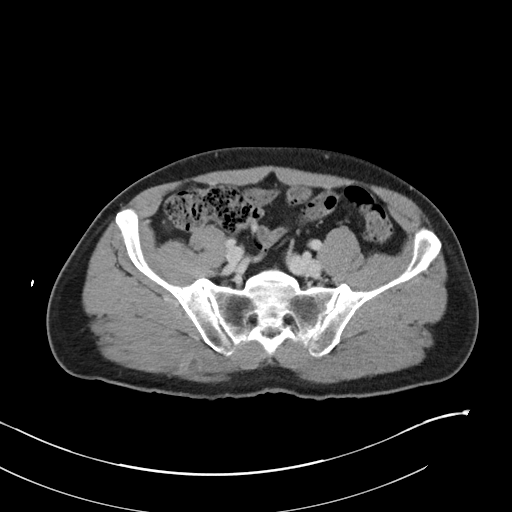
[im 43/97  soft-tissue]
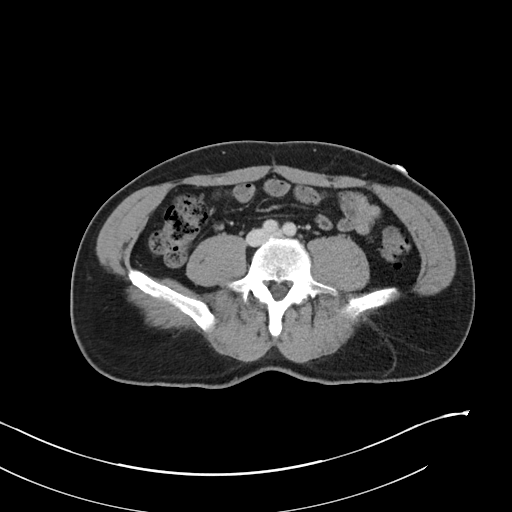
[im 49/97  soft-tissue]
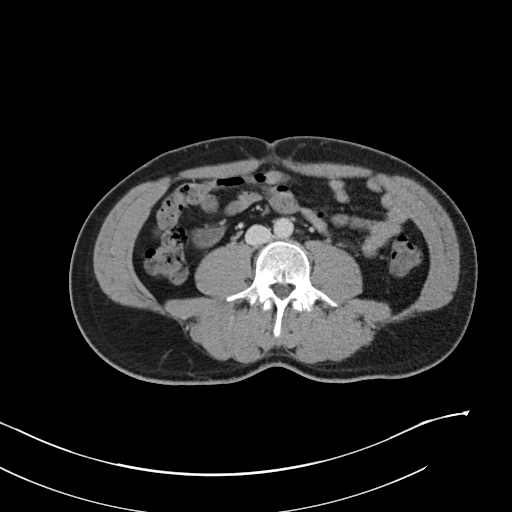
[im 54/97  soft-tissue]
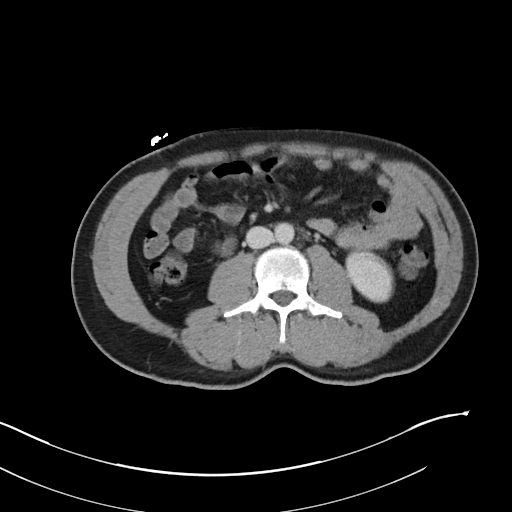
[im 65/97  soft-tissue]
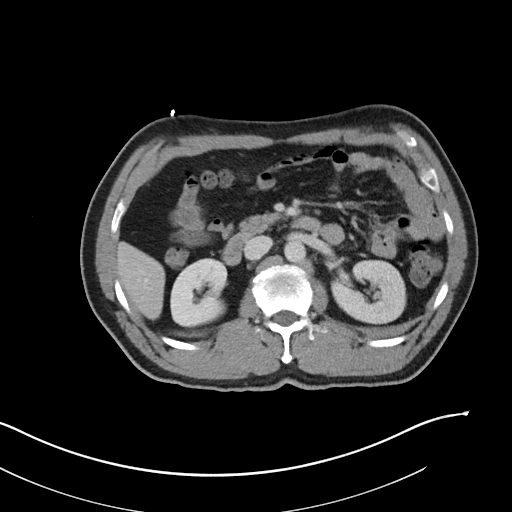
[im 65/97  bone]
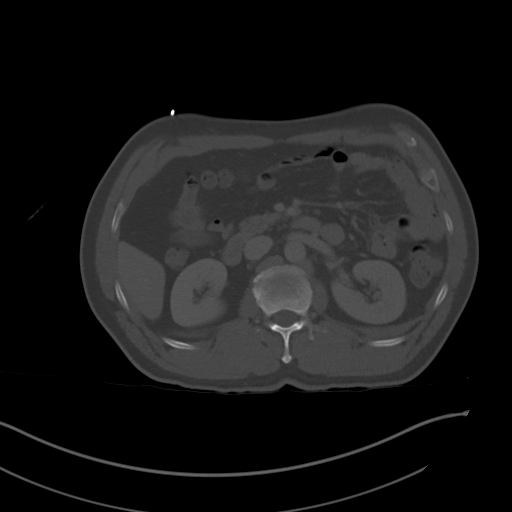
[im 70/97  soft-tissue]
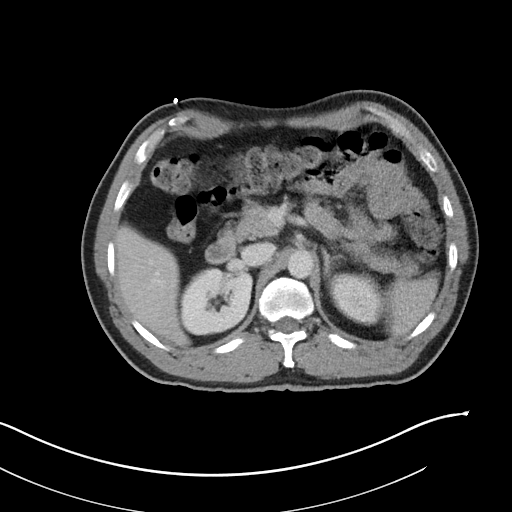
[im 75/97  soft-tissue]
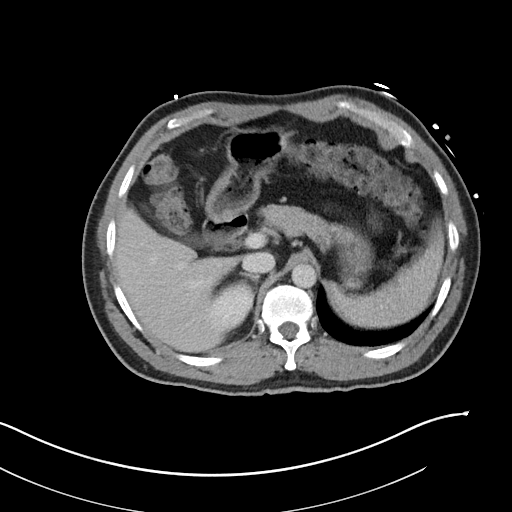
[im 86/97  soft-tissue]
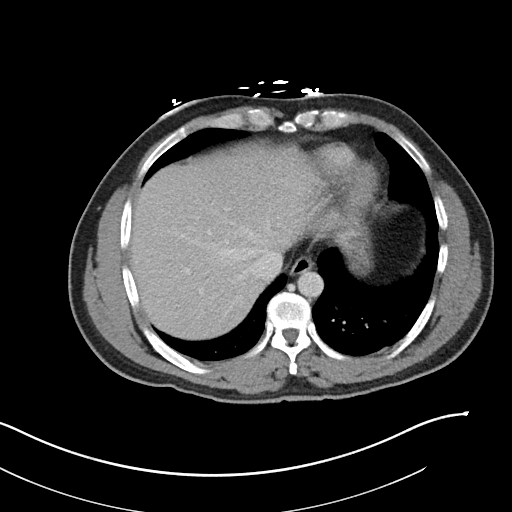
[im 91/97  soft-tissue]
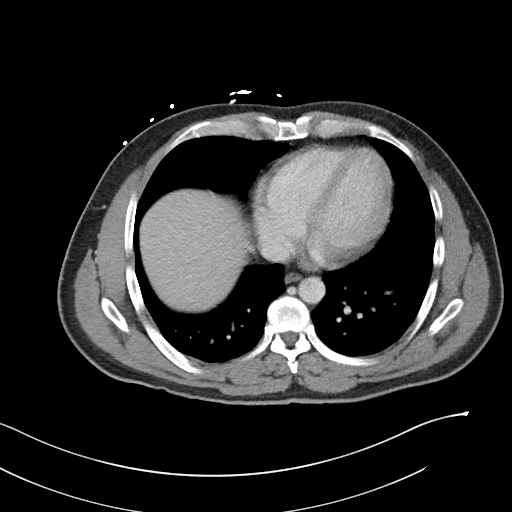

[Series 6: coronal soft tissue · coronal · 0.80mm/px · 3 of 92 slices shown]
[im 31/92  soft-tissue]
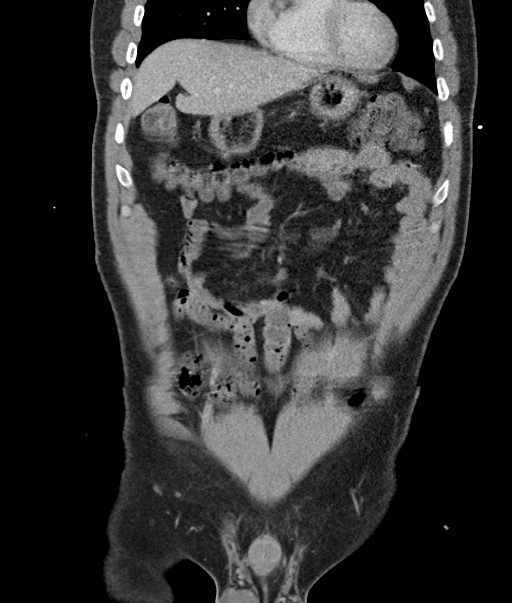
[im 41/92  soft-tissue]
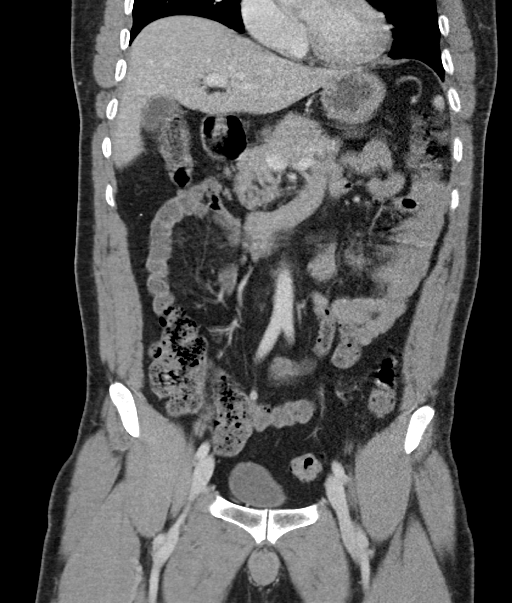
[im 51/92  soft-tissue]
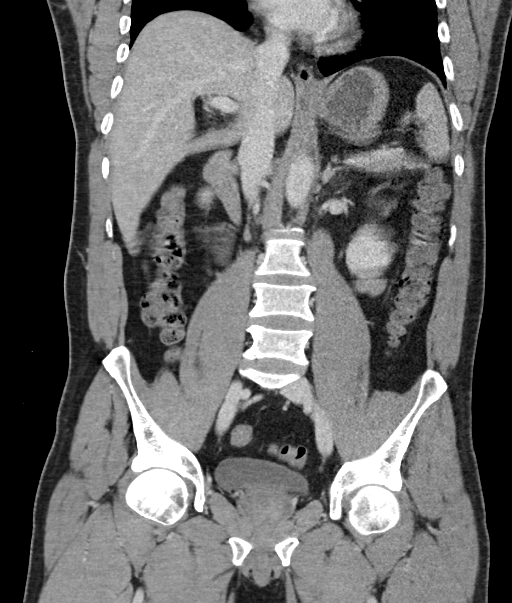

[16 of 46 positions shown; findings below may reference images not displayed]

FINDINGS: Lower chest: No acute abnormality.

Hepatobiliary: No focal liver abnormality is seen. No gallstones,
gallbladder wall thickening, or biliary dilatation.

Pancreas: Unremarkable. No pancreatic ductal dilatation or
surrounding inflammatory changes.

Spleen: Normal in size without focal abnormality.

Adrenals/Urinary Tract: Normal appearance of the adrenal glands.

No kidney mass or hydronephrosis identified bilaterally. Urinary
bladder appears normal.

Stomach/Bowel: Stomach appears within normal limits. No bowel wall
thickening, inflammation, or distension. The appendix is visualized
and appears normal. Distal colonic diverticula noted without signs
of diverticulitis.

Vascular/Lymphatic: No significant vascular findings are present. No
enlarged abdominal or pelvic lymph nodes.

Reproductive: Prostate is unremarkable.

Other: No free fluid or fluid collections identified within the
abdomen or pelvis.

Musculoskeletal: No acute or significant osseous findings.
IMPRESSION: 1. No acute findings within the abdomen or pelvis.

## 2024-06-20 ENCOUNTER — Encounter: Payer: Self-pay | Admitting: Nurse Practitioner

## 2024-06-20 ENCOUNTER — Ambulatory Visit (INDEPENDENT_AMBULATORY_CARE_PROVIDER_SITE_OTHER): Payer: Self-pay | Admitting: Nurse Practitioner

## 2024-06-20 VITALS — BP 124/80 | HR 84 | Temp 98.1°F | Ht 62.0 in | Wt 166.0 lb

## 2024-06-20 DIAGNOSIS — H539 Unspecified visual disturbance: Secondary | ICD-10-CM

## 2024-06-20 DIAGNOSIS — Z1329 Encounter for screening for other suspected endocrine disorder: Secondary | ICD-10-CM

## 2024-06-20 DIAGNOSIS — H9313 Tinnitus, bilateral: Secondary | ICD-10-CM

## 2024-06-20 DIAGNOSIS — Z131 Encounter for screening for diabetes mellitus: Secondary | ICD-10-CM

## 2024-06-20 DIAGNOSIS — Z1211 Encounter for screening for malignant neoplasm of colon: Secondary | ICD-10-CM

## 2024-06-20 DIAGNOSIS — K5900 Constipation, unspecified: Secondary | ICD-10-CM

## 2024-06-20 DIAGNOSIS — Z13220 Encounter for screening for lipoid disorders: Secondary | ICD-10-CM

## 2024-06-20 DIAGNOSIS — Z0000 Encounter for general adult medical examination without abnormal findings: Secondary | ICD-10-CM

## 2024-06-20 LAB — POCT GLYCOSYLATED HEMOGLOBIN (HGB A1C): Hemoglobin A1C: 5.9 % — AB (ref 4.0–5.6)

## 2024-06-20 MED ORDER — DOCUSATE SODIUM 100 MG PO CAPS: 100.0000 mg | ORAL_CAPSULE | Freq: Two times a day (BID) | ORAL | 0 refills | Status: AC

## 2024-06-20 MED ORDER — HYDROCORTISONE (PERIANAL) 2.5 % EX CREA
1.0000 | TOPICAL_CREAM | Freq: Two times a day (BID) | CUTANEOUS | 0 refills | Status: AC
Start: 2024-06-20 — End: ?

## 2024-06-20 NOTE — Progress Notes (Signed)
 Subjective   Patient ID: Jason Haynes, male    DOB: [birth year], 48 y.o.   MRN: [id number]  Chief Complaint  Patient presents with   Establish Care    Referring provider: No ref. provider found  Jason Haynes is a 48 y.o. male with Past Medical History: No date: BMI 27.0-27.9,adult No date: GERD (gastroesophageal reflux disease)   HPI  Patient presents today to establish care.  He does have an interpreter with him in the office today.  He complains of ringing in both ears for the past 2 years and difficulty with vision.  Will place a referral to ENT neuro-optometry for the patient.  He states that he has also been having issues with constipation and burning to his rectum after having a bowel movement.  We will order hemorrhoid cream and Colace.  Patient states that he does have a history of prediabetes.  A1c in office today is 5.9.  Patient is requesting routine labs.  We will go ahead and order labs for physical today. Denies f/c/s, n/v/d, hemoptysis, PND, leg swelling Denies chest pain or edema     No Known Allergies   There is no immunization history on file for this patient.  Tobacco History: Social History   Tobacco Use  Smoking Status Never  Smokeless Tobacco Never   Counseling given: Not Answered   Outpatient Encounter Medications as of 06/20/2024  Medication Sig   docusate sodium  (COLACE) 100 MG capsule Take 1 capsule (100 mg total) by mouth 2 (two) times daily.   hydrocortisone  (ANUSOL -HC) 2.5 % rectal cream Place 1 Application rectally 2 (two) times daily.   dicyclomine  (BENTYL ) 10 MG capsule Take 1 capsule (10 mg total) by mouth 4 (four) times daily -  before meals and at bedtime. (Patient not taking: Reported on 06/20/2024)   doxycycline  (VIBRAMYCIN ) 100 MG capsule Take 1 capsule (100 mg total) by mouth 2 (two) times daily. (Patient not taking: Reported on 06/20/2024)   Green Tea, Camellia sinensis, 1000 MG TABS Take by mouth. As needed to help with  stomach issues. (Patient not taking: No sig reported)   HYDROcodone -acetaminophen  (NORCO/VICODIN) 5-325 MG tablet Take 1 tablet by mouth every 4 (four) hours as needed for moderate pain. (Patient not taking: Reported on 06/20/2024)   ibuprofen  (ADVIL ) 800 MG tablet Take 1 tablet (800 mg total) by mouth every 8 (eight) hours as needed for moderate pain. (Patient not taking: Reported on 06/20/2024)   omeprazole  (PRILOSEC) 40 MG capsule Take 1 capsule (40 mg total) by mouth in the morning and at bedtime. (Patient not taking: Reported on 06/20/2024)   No facility-administered encounter medications on file as of 06/20/2024.    Review of Systems  Review of Systems  Constitutional: Negative.   HENT:  Positive for tinnitus.   Eyes:  Positive for visual disturbance.  Cardiovascular: Negative.   Gastrointestinal:  Positive for constipation.  Allergic/Immunologic: Negative.   Neurological: Negative.   Psychiatric/Behavioral: Negative.       Objective:   BP 124/80   Pulse 84   Temp 98.1 F (36.7 C) (Oral)   Ht 5' 2 (1.575 m)   Wt 166 lb (75.3 kg)   SpO2 100%   BMI 30.36 kg/m   Wt Readings from Last 5 Encounters:  06/20/24 166 lb (75.3 kg)  02/27/21 169 lb 1.5 oz (76.7 kg)  12/06/20 169 lb (76.7 kg)  11/28/20 169 lb (76.7 kg)  10/28/20 165 lb 5.5 oz (75 kg)     Physical  Exam Vitals and nursing note reviewed.  Constitutional:      General: He is not in acute distress.    Appearance: He is well-developed.  Cardiovascular:     Rate and Rhythm: Normal rate and regular rhythm.  Pulmonary:     Effort: Pulmonary effort is normal.     Breath sounds: Normal breath sounds.  Skin:    General: Skin is warm and dry.  Neurological:     Mental Status: He is alert and oriented to person, place, and time.       Assessment & Plan:   Vision changes -     Ambulatory referral to Optometry  Tinnitus of both ears -     Ambulatory referral to ENT  Constipation, unspecified constipation  type -     Docusate Sodium ; Take 1 capsule (100 mg total) by mouth 2 (two) times daily.  Dispense: 10 capsule; Refill: 0 -     Hydrocortisone  (Perianal); Place 1 Application rectally 2 (two) times daily.  Dispense: 30 g; Refill: 0  Routine adult health maintenance -     CBC -     Comprehensive metabolic panel with GFR  Lipid screening -     Lipid panel  Thyroid disorder screen -     TSH  Colon cancer screening -     Cologuard  Screening for diabetes mellitus -     POCT glycosylated hemoglobin (Hb A1C)     Return in about 3 months (around 09/20/2024).   Jason GORMAN Borer, NP 06/20/2024

## 2024-06-21 LAB — CBC
Hematocrit: 50.5 % (ref 37.5–51.0)
Hemoglobin: 16.4 g/dL (ref 13.0–17.7)
MCH: 29.1 pg (ref 26.6–33.0)
MCHC: 32.5 g/dL (ref 31.5–35.7)
MCV: 90 fL (ref 79–97)
Platelets: 188 x10E3/uL (ref 150–450)
RBC: 5.63 x10E6/uL (ref 4.14–5.80)
RDW: 12.7 % (ref 11.6–15.4)
WBC: 4 x10E3/uL (ref 3.4–10.8)

## 2024-06-21 LAB — COMPREHENSIVE METABOLIC PANEL WITH GFR
ALT: 29 IU/L (ref 0–44)
AST: 22 IU/L (ref 0–40)
Albumin: 4.7 g/dL (ref 4.1–5.1)
Alkaline Phosphatase: 105 IU/L (ref 44–121)
BUN/Creatinine Ratio: 11 (ref 9–20)
BUN: 10 mg/dL (ref 6–24)
Bilirubin Total: 0.7 mg/dL (ref 0.0–1.2)
CO2: 22 mmol/L (ref 20–29)
Calcium: 9.7 mg/dL (ref 8.7–10.2)
Chloride: 104 mmol/L (ref 96–106)
Creatinine, Ser: 0.87 mg/dL (ref 0.76–1.27)
Globulin, Total: 2.4 g/dL (ref 1.5–4.5)
Glucose: 98 mg/dL (ref 70–99)
Potassium: 4 mmol/L (ref 3.5–5.2)
Sodium: 141 mmol/L (ref 134–144)
Total Protein: 7.1 g/dL (ref 6.0–8.5)
eGFR: 106 mL/min/1.73 (ref >59)

## 2024-06-21 LAB — TSH: TSH: 0.902 u[IU]/mL (ref 0.450–4.500)

## 2024-06-21 LAB — LIPID PANEL
Chol/HDL Ratio: 5.4 ratio — ABNORMAL HIGH (ref 0.0–5.0)
Cholesterol, Total: 158 mg/dL (ref 100–199)
HDL: 29 mg/dL — ABNORMAL LOW (ref >39)
LDL Chol Calc (NIH): 110 mg/dL — ABNORMAL HIGH (ref 0–99)
Triglycerides: 100 mg/dL (ref 0–149)
VLDL Cholesterol Cal: 19 mg/dL (ref 5–40)

## 2024-06-22 ENCOUNTER — Ambulatory Visit: Payer: Self-pay | Admitting: Nurse Practitioner

## 2024-06-29 ENCOUNTER — Telehealth: Payer: Self-pay

## 2024-06-29 NOTE — Telephone Encounter (Signed)
 Copied from CRM [id number]. Topic: Clinical - Lab/Test Results >> Jun 28, 2024  3:55 PM Montie POUR wrote: Reason for CRM:  Please call Leith with his lab results. He would like to discuss with a nurse. His number is [phone]

## 2024-07-02 LAB — COLOGUARD: COLOGUARD: NEGATIVE

## 2024-09-23 ENCOUNTER — Ambulatory Visit: Payer: Self-pay | Admitting: Nurse Practitioner

## 2024-10-06 ENCOUNTER — Encounter (INDEPENDENT_AMBULATORY_CARE_PROVIDER_SITE_OTHER): Payer: Self-pay

## 2024-11-04 ENCOUNTER — Encounter (INDEPENDENT_AMBULATORY_CARE_PROVIDER_SITE_OTHER): Payer: Self-pay | Admitting: Physician Assistant

## 2024-11-04 ENCOUNTER — Ambulatory Visit (INDEPENDENT_AMBULATORY_CARE_PROVIDER_SITE_OTHER): Payer: Self-pay | Admitting: Physician Assistant

## 2024-11-04 VITALS — BP 131/82 | HR 72 | Ht 62.0 in | Wt 160.0 lb

## 2024-11-04 DIAGNOSIS — H9193 Unspecified hearing loss, bilateral: Secondary | ICD-10-CM

## 2024-11-04 DIAGNOSIS — H6123 Impacted cerumen, bilateral: Secondary | ICD-10-CM

## 2024-11-04 NOTE — Progress Notes (Signed)
 Dear Dr. Oley, Here is my assessment for our mutual patient, Jason Haynes. Thank you for allowing me the opportunity to care for your patient. Please do not hesitate to contact me should you have any other questions. Sincerely, Chyrl Cohen PA-C  Otolaryngology Clinic Note Referring provider: Dr. Oley HPI:  Jason Haynes is a 49 y.o. male kindly referred by Dr. Oley   Discussed the use of AI scribe software for clinical note transcription with the patient, who gave verbal consent to proceed.  History of Present Illness   Jason Haynes is a 49 year old male who presents with bilateral hearing loss and impacted cerumen. In person interpreter present.  Since a COVID-19 infection in 2021, he has experienced persistent bilateral ear symptoms, including intermittent otalgia, muffled hearing, and occasional watery otorrhea. He notes that the sensation of muffled hearing is most pronounced while driving, though his hearing was normal on the day of evaluation.  He perceives intermittent auditory phenomena described as a 'little noise' in both ears, which occur with head movement, exposure to air conditioning, and while showering. These noises are absent when he is stationary and are not constant.  He also reports intermittent tingling and burning sensations in his hands, as well as pain and paresthesia in the posterior head, all of which began after his COVID-19 infection.  He denies any history of ear trauma, otologic surgery, significant noise exposure aside from occasional drywall sanding, tonsillectomy, or seasonal allergies.           Independent Review of Additional Tests or Records:  None   PMH/Meds/All/SocHx/FamHx/ROS:   Past Medical History:  Diagnosis Date   BMI 27.0-27.9,adult    GERD (gastroesophageal reflux disease)      History reviewed. No pertinent surgical history.  Family History  Family history unknown: Yes     Social Connections: Not on file      Current Medications[1]   Physical Exam:   BP 131/82 (BP Location: Left Arm, Patient Position: Sitting, Cuff Size: Large)   Pulse 72   Ht 5' 2 (1.575 m)   Wt 160 lb (72.6 kg)   SpO2 97%   BMI 29.26 kg/m   Pertinent Findings  CN II-XII grossly intact Bilateral cerumen impaction Anterior rhinoscopy: Septum midline; bilateral inferior turbinates with no hypertrophy No lesions of oral cavity/oropharynx; dentition is in normal limits No obviously palpable neck masses/lymphadenopathy/thyromegaly No respiratory distress or stridor   Seprately Identifiable Procedures:  Procedure: Bilateral ear microscopy and cerumen removal using microscope (CPT [phone]) - Mod 50 Pre-procedure diagnosis: bilateral cerumen impaction external auditory canals Post-procedure diagnosis: same Indication: bilateral cerumen impaction; given patient's otologic complaints and history as well as for improved and comprehensive examination of external ear and tympanic membrane, bilateral otologic examination using microscope was performed and impacted cerumen removed  Procedure: Patient was placed semi-recumbent. Both ear canals were examined using the microscope with findings above. Cerumen removed from bilateral external auditory canals using suction and currette with improvement in EAC examination and patency. Left: EAC was patent. TM was intact . Middle ear was aerated. Drainage: none Right: EAC was patent. TM was intact . Middle ear was aerated . Drainage: none Patient tolerated the procedure well.   Impression & Plans:  Jason Haynes is a 49 y.o. male with the following   Assessment and Plan    Bilateral hearing loss with impacted cerumen Bilateral cerumen impaction likely contributing to hearing loss. Post-viral sequelae possible. Further evaluation needed to rule out additional  pathology. - Ordered audiometric evaluation to assess hearing loss and middle/inner ear function. - Arranged follow-up via  telephone to review audiometric results and determine further management.        - f/u Phone call with audio results    Thank you for allowing me the opportunity to care for your patient. Please do not hesitate to contact me should you have any other questions.  Sincerely, Chyrl Cohen PA-C [facility] Phone: [phone] Fax: [phone]  11/04/2024, 11:13 AM        [1]  Current Outpatient Medications:    docusate sodium  (COLACE) 100 MG capsule, Take 1 capsule (100 mg total) by mouth 2 (two) times daily., Disp: 10 capsule, Rfl: 0   hydrocortisone  (ANUSOL -HC) 2.5 % rectal cream, Place 1 Application rectally 2 (two) times daily., Disp: 30 g, Rfl: 0   dicyclomine  (BENTYL ) 10 MG capsule, Take 1 capsule (10 mg total) by mouth 4 (four) times daily -  before meals and at bedtime. (Patient not taking: Reported on 11/04/2024), Disp: 120 capsule, Rfl: 3   doxycycline  (VIBRAMYCIN ) 100 MG capsule, Take 1 capsule (100 mg total) by mouth 2 (two) times daily. (Patient not taking: Reported on 11/04/2024), Disp: 20 capsule, Rfl: 0   Green Tea, Camellia sinensis, 1000 MG TABS, Take by mouth. As needed to help with stomach issues. (Patient not taking: Reported on 11/04/2024), Disp: , Rfl:    HYDROcodone -acetaminophen  (NORCO/VICODIN) 5-325 MG tablet, Take 1 tablet by mouth every 4 (four) hours as needed for moderate pain. (Patient not taking: Reported on 11/04/2024), Disp: 10 tablet, Rfl: 0   ibuprofen  (ADVIL ) 800 MG tablet, Take 1 tablet (800 mg total) by mouth every 8 (eight) hours as needed for moderate pain. (Patient not taking: Reported on 11/04/2024), Disp: 20 tablet, Rfl: 0   omeprazole  (PRILOSEC) 40 MG capsule, Take 1 capsule (40 mg total) by mouth in the morning and at bedtime. (Patient not taking: Reported on 11/04/2024), Disp: 90 capsule, Rfl: 3

## 2024-11-09 ENCOUNTER — Ambulatory Visit (INDEPENDENT_AMBULATORY_CARE_PROVIDER_SITE_OTHER): Payer: Self-pay

## 2024-11-09 DIAGNOSIS — H9042 Sensorineural hearing loss, unilateral, left ear, with unrestricted hearing on the contralateral side: Secondary | ICD-10-CM

## 2024-11-09 NOTE — Progress Notes (Signed)
 " [address], [address] [place], [place] [address] [phone]  Audiological Evaluation    Name: Jason Haynes     DOB:   08-Aug-1976      MRN:   [id number]                                                                                     Service Date: 11/09/2024     Accompanied by: self    Patient comes today after Reyes Cohen, PA-C sent a referral for a hearing evaluation due to concerns with decrease hearing.  Patient is spanish speaking and the green cart interpreter service was used for translation.  Translator for case history and instructions was Cena [phone].  Translator for results and next actions was [place] [phone]     Symptoms Yes Details  Hearing loss  [x]  Left ear does not hear the same as the right ear, sensitive to sounds.  Issues started about 3 years ago when he had COVID  Tinnitus  [x]  Occurs in both ears but more in the left ear.  Only occurs occasionally and in certain conditions like listening to machinery.  Not constant. Onset 3 years ago   Ear pain/ infections/pressure  []  No pain, no issues with Valsalva, no history of ear infections, no sinus or allergy issues  Balance problems  [x]  Sometimes, 1-2x a week, lasting 3-4secs, onset 2-3 years ago  Noise exposure history  [x]  Works with a insurance underwriter and wears hearing protection  Previous ear surgeries  []    Family history of hearing loss  []    Amplification  []    Other  [x]  Watery drainage in both ears with onset 1 year ago.  Member has tingling feeling in front of both ears (check area) and down his neck    Otoscopy: Right ear: Clear external ear canal and notable landmarks visualized on the tympanic membrane. Left ear:  Clear external ear canal and notable landmarks visualized on the tympanic membrane.  Tympanometry: Right ear: Type A - Normal external ear canal volume with normal middle ear pressure and normal tympanic membrane compliance. Findings are consistent with normal middle ear  function. Left ear: Type A - Normal external ear canal volume with normal middle ear pressure and normal tympanic membrane compliance. Findings are consistent with normal middle ear function.  Acoustic Reflex (ipsilateral left ear [phone] only) Reflexes were present at the 80-85 dB HL.  Findings are consistent with normal left ear middle ear, cochlea, and neural innervation of the stapedius muscle.      Hearing Evaluation The hearing test results were completed under headphones and re-checked with inserts and results are deemed to be of good reliability. Test technique:  conventional    Pure tone Audiometry: Right ear- Normal hearing from [phone] Hz. Left ear-  Normal hearing from [phone], and 8000 Hz.  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     Impression: There is not  a significant difference in pure-tone thresholds between ears.  Only very slight difference found that is not significant.     Recommendations: Follow up with ENT as scheduled. Return for a hearing evaluation if concerns with hearing changes arise or per MD recommendation.   Arlean Rake, AUD     "

## 2024-11-16 ENCOUNTER — Telehealth (INDEPENDENT_AMBULATORY_CARE_PROVIDER_SITE_OTHER): Payer: Self-pay | Admitting: Physician Assistant

## 2024-11-16 ENCOUNTER — Ambulatory Visit (INDEPENDENT_AMBULATORY_CARE_PROVIDER_SITE_OTHER): Payer: Self-pay | Admitting: Physician Assistant

## 2024-11-16 NOTE — Telephone Encounter (Signed)
 Called patient as he missed appointment. I offered audio visit but he wants to be seen in person to discuss results. I will have office call him to reschedule.
